# Patient Record
Sex: Male | Born: 1935 | Race: White | Hispanic: No | Marital: Married | Smoking: Former smoker
Health system: Southern US, Community
[De-identification: ages and names within clinical notes are randomized; demographics above are authoritative.]

## PROBLEM LIST (undated history)

## (undated) DIAGNOSIS — K859 Acute pancreatitis without necrosis or infection, unspecified: Secondary | ICD-10-CM

## (undated) DIAGNOSIS — K2970 Gastritis, unspecified, without bleeding: Secondary | ICD-10-CM

## (undated) DIAGNOSIS — K648 Other hemorrhoids: Secondary | ICD-10-CM

## (undated) DIAGNOSIS — N19 Unspecified kidney failure: Secondary | ICD-10-CM

## (undated) DIAGNOSIS — E119 Type 2 diabetes mellitus without complications: Secondary | ICD-10-CM

## (undated) DIAGNOSIS — K449 Diaphragmatic hernia without obstruction or gangrene: Secondary | ICD-10-CM

## (undated) DIAGNOSIS — K5790 Diverticulosis of intestine, part unspecified, without perforation or abscess without bleeding: Secondary | ICD-10-CM

## (undated) HISTORY — DX: Unspecified kidney failure: N19

## (undated) HISTORY — DX: Diaphragmatic hernia without obstruction or gangrene: K44.9

## (undated) HISTORY — DX: Other hemorrhoids: K64.8

## (undated) HISTORY — DX: Type 2 diabetes mellitus without complications: E11.9

## (undated) HISTORY — DX: Gastritis, unspecified, without bleeding: K29.70

## (undated) HISTORY — PX: KNEE SURGERY: SHX244

## (undated) HISTORY — DX: Diverticulosis of intestine, part unspecified, without perforation or abscess without bleeding: K57.90

## (undated) HISTORY — DX: Acute pancreatitis without necrosis or infection, unspecified: K85.90

## (undated) HISTORY — PX: ELBOW SURGERY: SHX618

## (undated) HISTORY — PX: OTHER SURGICAL HISTORY: SHX169

---

## 1998-08-08 ENCOUNTER — Ambulatory Visit (HOSPITAL_COMMUNITY): Admission: RE | Admit: 1998-08-08 | Discharge: 1998-08-08 | Payer: Self-pay | Admitting: Internal Medicine

## 1999-10-09 ENCOUNTER — Ambulatory Visit (HOSPITAL_COMMUNITY): Admission: RE | Admit: 1999-10-09 | Discharge: 1999-10-09 | Payer: Self-pay | Admitting: Orthopedic Surgery

## 1999-10-14 ENCOUNTER — Encounter: Payer: Self-pay | Admitting: Orthopedic Surgery

## 1999-10-14 ENCOUNTER — Encounter: Admission: RE | Admit: 1999-10-14 | Discharge: 1999-10-14 | Payer: Self-pay | Admitting: Orthopedic Surgery

## 2004-01-28 ENCOUNTER — Ambulatory Visit: Payer: Self-pay | Admitting: Family Medicine

## 2004-03-02 ENCOUNTER — Ambulatory Visit: Payer: Self-pay | Admitting: Family Medicine

## 2004-03-04 ENCOUNTER — Ambulatory Visit: Payer: Self-pay | Admitting: Family Medicine

## 2004-06-15 ENCOUNTER — Ambulatory Visit: Payer: Self-pay

## 2004-07-02 ENCOUNTER — Ambulatory Visit: Payer: Self-pay | Admitting: Cardiology

## 2004-07-14 ENCOUNTER — Ambulatory Visit: Payer: Self-pay | Admitting: *Deleted

## 2004-07-22 ENCOUNTER — Ambulatory Visit: Payer: Self-pay | Admitting: Cardiology

## 2004-08-13 ENCOUNTER — Ambulatory Visit: Payer: Self-pay | Admitting: Family Medicine

## 2005-01-25 ENCOUNTER — Ambulatory Visit: Payer: Self-pay | Admitting: Family Medicine

## 2006-06-03 ENCOUNTER — Ambulatory Visit: Payer: Self-pay | Admitting: Gastroenterology

## 2006-06-17 ENCOUNTER — Ambulatory Visit: Payer: Self-pay | Admitting: Gastroenterology

## 2009-07-08 ENCOUNTER — Ambulatory Visit (HOSPITAL_BASED_OUTPATIENT_CLINIC_OR_DEPARTMENT_OTHER): Admission: RE | Admit: 2009-07-08 | Discharge: 2009-07-08 | Payer: Self-pay | Admitting: Orthopedic Surgery

## 2010-06-09 LAB — BASIC METABOLIC PANEL
BUN: 16 mg/dL (ref 6–23)
CO2: 30 mEq/L (ref 19–32)
Calcium: 8.8 mg/dL (ref 8.4–10.5)
Chloride: 105 mEq/L (ref 96–112)
Creatinine, Ser: 0.67 mg/dL (ref 0.4–1.5)
GFR calc Af Amer: >60 mL/min (ref >60)
GFR calc non Af Amer: >60 mL/min (ref >60)
Glucose, Bld: 123 mg/dL — ABNORMAL HIGH (ref 70–99)
Potassium: 4.4 mEq/L (ref 3.5–5.1)
Sodium: 138 mEq/L (ref 135–145)

## 2010-06-09 LAB — POCT HEMOGLOBIN-HEMACUE: Hemoglobin: 13.4 g/dL (ref 13.0–17.0)

## 2011-12-02 ENCOUNTER — Encounter: Payer: Self-pay | Admitting: Gastroenterology

## 2011-12-31 ENCOUNTER — Ambulatory Visit: Payer: Self-pay | Admitting: Gastroenterology

## 2012-01-11 ENCOUNTER — Ambulatory Visit: Payer: Self-pay | Admitting: Gastroenterology

## 2013-04-04 ENCOUNTER — Encounter: Payer: Self-pay | Admitting: Nurse Practitioner

## 2013-04-04 ENCOUNTER — Telehealth: Payer: Self-pay | Admitting: *Deleted

## 2013-04-04 ENCOUNTER — Ambulatory Visit (INDEPENDENT_AMBULATORY_CARE_PROVIDER_SITE_OTHER): Payer: Medicare Other | Admitting: Nurse Practitioner

## 2013-04-04 VITALS — BP 124/82 | HR 70 | Ht 70.0 in | Wt 190.4 lb

## 2013-04-04 DIAGNOSIS — R109 Unspecified abdominal pain: Secondary | ICD-10-CM | POA: Insufficient documentation

## 2013-04-04 MED ORDER — TRAMADOL HCL 50 MG PO TABS: 50.0000 mg | ORAL_TABLET | Freq: Four times a day (QID) | ORAL | Status: DC | PRN

## 2013-04-04 NOTE — Progress Notes (Signed)
HPI :  Patient is a [age] year who had a screening colonoscopy by Dr. Jarold MottoPatterson in 2008. Patient is self-referred now for evaluation of abdominal pain and recent diarrhea. In 2013 patient saw Dr. Chales AbrahamsGupta (gastroenterologist in [place]) for  unrelated abdominal pain. He underwent EGD and colonoscopy which were normal per patient. There was a question of whether Metformin was causing the pain. Then a year or so ago patient began having periumbilical pain. He was referred back to Dr. Chales AbrahamsGupta in October 2014, a CTscan was done and also normal per patient. A couple of months ago patient developed constipation which he felt was secondary to medication he had started for facial pain. He began taking what sounds like a stool softener / laxative combination. Approximately 10 days ago patient developed acute diarrhea associated with an escalation of his periumbilical pain. He hadn't taken any laxatives for several days. Patient saw PCP in [place] who apparently felt he had food poisoning ( I don't have records). Patient was given 3 days of Bactrim and told to take Pepto. The diarrhea has significantly improved but is patient still having acute on chronic periumbilical pain so he decided to come here for further evaluation. The periumbilical pain isn't necessarily related to eating. Though the pain occurs sometimes after eating it also occurs at 10pm at night and wakes him up around 4am every morning. It is not relieved with defecation.   Past Medical History  Diagnosis Date  . Diverticulosis   . Diabetes    Family History  Problem Relation Age of Onset  . Colon cancer Father   . Thyroid cancer Sister    History  Substance Use Topics  . Smoking status: Never Smoker   . Smokeless tobacco: Never Used  . Alcohol Use: No   Current Outpatient Prescriptions  Medication Sig Dispense Refill  . atorvastatin (LIPITOR) 40 MG tablet       . B-D UF III MINI PEN NEEDLES 31G X 5 MM MISC       . latanoprost (XALATAN)  0.005 % ophthalmic solution       . oxybutynin (DITROPAN-XL) 10 MG 24 hr tablet       . valsartan (DIOVAN) 320 MG tablet       . VICTOZA 18 MG/3ML SOPN       . traMADol (ULTRAM) 50 MG tablet Take 1 tablet (50 mg total) by mouth every 6 (six) hours as needed.  40 tablet  0   No current facility-administered medications for this visit.   No Known Allergies  Review of Systems: All systems reviewed and negative except where noted in HPI.   Physical Exam: BP 124/82  Pulse 70  Ht 5\' 10"  (1.778 m)  Wt 190 lb 6.4 oz (86.365 kg)  BMI 27.32 kg/m2 Constitutional: Pleasant,well-developed, white male in no acute distress. Psychiatric: Normal mood and affect. Behavior is normal.   ASSESSMENT AND PLAN:  371. 80 year old male with chronic periumbilical pain. Patient has been followed by Dr. Chales AbrahamsGupta, a gastroenterologist in [place]. Per patient, an EGD, colonoscopy and CTscan were negative. Patient doesn't feel he is "getting anywhere" with his current gastroenterologist and would like to return to [facility]. Unfortunately I don't have any of his records, from PCP nor Dr. Chales AbrahamsGupta. We will request records and have patient come back for evaluation. He is asking for pain medication. I gave him Ultram.   2. Acute diarrhea, resolving. He took 3 days of Bactrim + Bismuth. This may have been  infectious, hard to know at this point. If he has recurrent diarrhea will check stool studies.

## 2013-04-04 NOTE — Telephone Encounter (Signed)
I called the pharmacy and spoke to pharmacist.  She said they have already filled the prescription. They did receive our faxed prescription. They called the patient to come pick up the medication.

## 2013-04-04 NOTE — Telephone Encounter (Signed)
Message copied by Derry SkillPETERMAN, Jocilynn Grade K on Wed Apr 04, 2013  1:58 PM ------      Message from: Karna ChristmasHARRIS, CHRISTY D      Created: Wed Apr 04, 2013 12:54 PM       Pt said his medication is not at the pharmacy...Marland Kitchen.needs it resent ------

## 2013-04-04 NOTE — Patient Instructions (Signed)
Make an appointment to see Dr. Sheryn Bisonavid Patterson before he retirement date of 05-04-2012.   We are sending the release with your signature to Dr. Urban GibsonGupta's office so we can get your records.

## 2013-04-05 ENCOUNTER — Telehealth: Payer: Self-pay | Admitting: Nurse Practitioner

## 2013-04-05 NOTE — Telephone Encounter (Signed)
Per Willette ClusterPaula Guenther, NP, stop Tramadol. Patient should seek care at his PCP or ED in [place]. Patient's wife aware.

## 2013-04-05 NOTE — Telephone Encounter (Signed)
Spoke with patient's wife. She states patient has vomited x 3 since last night and is very weak. States he is having pain below the belly button in the middle of his stomach. Denies fever. States patient is not having diarrhea now. He did take Tramadol twice yesterday. Please, advise.

## 2013-04-10 ENCOUNTER — Ambulatory Visit: Payer: Medicare Other | Admitting: Internal Medicine

## 2013-04-13 ENCOUNTER — Encounter: Payer: Self-pay | Admitting: Internal Medicine

## 2013-04-18 ENCOUNTER — Telehealth: Payer: Self-pay | Admitting: Gastroenterology

## 2013-04-18 ENCOUNTER — Ambulatory Visit (INDEPENDENT_AMBULATORY_CARE_PROVIDER_SITE_OTHER): Payer: Medicare Other | Admitting: Internal Medicine

## 2013-04-18 ENCOUNTER — Encounter: Payer: Self-pay | Admitting: Internal Medicine

## 2013-04-18 VITALS — BP 128/68 | HR 84 | Ht 69.0 in | Wt 209.4 lb

## 2013-04-18 DIAGNOSIS — R5381 Other malaise: Secondary | ICD-10-CM

## 2013-04-18 DIAGNOSIS — E8770 Fluid overload, unspecified: Secondary | ICD-10-CM

## 2013-04-18 DIAGNOSIS — R1033 Periumbilical pain: Secondary | ICD-10-CM

## 2013-04-18 DIAGNOSIS — K5730 Diverticulosis of large intestine without perforation or abscess without bleeding: Secondary | ICD-10-CM | POA: Insufficient documentation

## 2013-04-18 DIAGNOSIS — I10 Essential (primary) hypertension: Secondary | ICD-10-CM | POA: Insufficient documentation

## 2013-04-18 DIAGNOSIS — E7849 Other hyperlipidemia: Secondary | ICD-10-CM | POA: Insufficient documentation

## 2013-04-18 DIAGNOSIS — Z8719 Personal history of other diseases of the digestive system: Secondary | ICD-10-CM

## 2013-04-18 DIAGNOSIS — K5900 Constipation, unspecified: Secondary | ICD-10-CM

## 2013-04-18 DIAGNOSIS — E119 Type 2 diabetes mellitus without complications: Secondary | ICD-10-CM | POA: Insufficient documentation

## 2013-04-18 DIAGNOSIS — E8779 Other fluid overload: Secondary | ICD-10-CM

## 2013-04-18 DIAGNOSIS — R5383 Other fatigue: Secondary | ICD-10-CM

## 2013-04-18 DIAGNOSIS — K5732 Diverticulitis of large intestine without perforation or abscess without bleeding: Secondary | ICD-10-CM

## 2013-04-18 MED ORDER — ONDANSETRON 4 MG PO TBDP: 4.0000 mg | ORAL_TABLET | Freq: Three times a day (TID) | ORAL | Status: DC | PRN

## 2013-04-18 MED ORDER — METRONIDAZOLE 250 MG PO TABS
250.0000 mg | ORAL_TABLET | Freq: Three times a day (TID) | ORAL | Status: DC
Start: 2013-04-18 — End: 2013-05-10

## 2013-04-18 MED ORDER — OMEPRAZOLE 20 MG PO CPDR: 20.0000 mg | DELAYED_RELEASE_CAPSULE | Freq: Every day | ORAL | Status: DC

## 2013-04-18 MED ORDER — CIPROFLOXACIN HCL 500 MG PO TABS: 500.0000 mg | ORAL_TABLET | Freq: Two times a day (BID) | ORAL | Status: DC

## 2013-04-18 MED ORDER — POLYETHYLENE GLYCOL 3350 17 GM/SCOOP PO POWD: 1.0000 | Freq: Every day | ORAL | Status: DC

## 2013-04-18 NOTE — Patient Instructions (Addendum)
We have sent the following medications to your pharmacy for you to pick up at your convenience: Zofran and cipro, flagyl please take as directed  You can take Miralax over the counter as needed  Continue taking omperazole  You have a follow up appointment with Doug SouJessica Zehr P.A.-C on 05/09/2013 @ 9:30am

## 2013-04-18 NOTE — Telephone Encounter (Signed)
Called pt. Told him Dr. Rhea BeltonPyrtle spoke to Dr. Sol Passerough' swife and there was no need to reduce his diabetes meds. So it was ok to go pick up his medications. Pt's wife verbalized understanding.

## 2013-04-18 NOTE — Telephone Encounter (Signed)
Message copied by Richardo HanksHOWALD, Clive Parcel A on Wed Apr 18, 2013  2:30 PM ------      Message from: HAZELWOOD, AMY L      Created: Wed Apr 18, 2013  2:14 PM       Dr. Rhea BeltonPyrtle called while you were at lunch about this patient.  He is the last one of the morning right?      He said he talked to his PCP and he approved Cipro without reduction of diabetes meds.  Can use Cipro along with Flagyl.  He said if you have any questions let him know.  Lemme know you got this :)            Thanks      Amy ------

## 2013-04-18 NOTE — Progress Notes (Signed)
Patient ID: RAYNALDO FALCO, male   DOB: 08/30/1932, 80 y.o.   MRN: [id number] HPI: Mr. Knoedler is a 80 yo male with past medical history of hypertension, hyperlipidemia, BPH, diverticulosis and a recent history of abdominal pain possible pancreatitis who is seen in followup. He was previously noted Dr. Jarold Motto and was seen by Willette Cluster, NP on 04/04/2013. He was seen for abdominal pain and recent diarrhea. After he left the appointment with Gunnar Fusi, he was admitted the next day for a six-day hospitalization in [place].  This hospitalization was for acute renal failure and pancreatitis. He was treated supportively with IV fluids and pain control. His renal failure improved as did his abdominal pain, though it did not resolve. During his hospitalization his Trudee Kuster was held thinking this may have been the inciting factor for pancreatitis.  He had a CT scan during the hospitalization which reportedly did not reveal pancreatitis or acute abnormality.  Today he returns for office followup with his wife. He continues to have periumbilical and anterior abdominal pain below the umbilicus. This tends to be worse with eating, though this is very inconsistent. There was a time he reports when he felt he got better with eating. He is taking oxycodone on occasion for this pain and it can be associated with nausea. There are times when he feels like bowel movements and passing gas helps his abdominal pain. He reports he is having bowel movements about once per day though for the last one to 2 days his stools have been a little harder requiring some straining. He seen no blood in his stool or melena. He feels his recent nausea is related to oxycodone dosing. He does report some mild increase in heartburn and dyspepsia. He has been eating TUMS and trying to drink carbonated beverages to help with belching. Prior to his appointment here 2 weeks ago he did have significant diarrhea but this has resolved. He noticed  swelling in his lower extremities after hospitalization and his primary care provider, Dr. Sol Passer recently started him on furosemide 20 mg twice daily. He thinks this is helping. He does report some dyspnea on exertion which has improved slightly but not returned to his baseline. Also considerable fatigue and malaise.  Past Medical History  Diagnosis Date  . Diverticulosis   . Diabetes   . Renal failure     dehydration  . Pancreatitis     History reviewed. No pertinent past surgical history.  Current Outpatient Prescriptions  Medication Sig Dispense Refill  . atorvastatin (LIPITOR) 40 MG tablet Take 20 mg by mouth daily.       . B-D UF III MINI PEN NEEDLES 31G X 5 MM MISC       . carvedilol (COREG) 12.5 MG tablet Take 12.5 mg by mouth 2 (two) times daily with a meal.      . clotrimazole-betamethasone (LOTRISONE) cream Apply 1 application topically 2 (two) times daily.      Marland Kitchen glimepiride (AMARYL) 4 MG tablet Take 4 mg by mouth daily with breakfast.       . latanoprost (XALATAN) 0.005 % ophthalmic solution Place 1 drop into both eyes at bedtime.       Marland Kitchen omeprazole (PRILOSEC) 20 MG capsule Take 20 mg by mouth daily.      Marland Kitchen oxybutynin (DITROPAN-XL) 10 MG 24 hr tablet Take 10 mg by mouth daily.       Marland Kitchen oxyCODONE (OXY IR/ROXICODONE) 5 MG immediate release tablet Take 5 mg by mouth every 6 (  six) hours as needed.       . traZODone (DESYREL) 50 MG tablet Take 50 mg by mouth at bedtime.      . valsartan (DIOVAN) 320 MG tablet Take 320 mg by mouth daily.        No current facility-administered medications for this visit.    No Known Allergies  Family History  Problem Relation Age of Onset  . Colon cancer Father   . Thyroid cancer Sister     History  Substance Use Topics  . Smoking status: Never Smoker   . Smokeless tobacco: Never Used  . Alcohol Use: No    ROS: As per history of present illness, otherwise negative  BP 128/68  Pulse 84  Ht 5\' 9"  (1.753 m)  Wt 209 lb 6 oz (94.972  kg)  BMI 30.91 kg/m2 Constitutional: Well-developed and well-nourished. No distress. HEENT: Normocephalic and atraumatic. Oropharynx is clear and moist. No oropharyngeal exudate. Conjunctivae are normal.  No scleral icterus. Neck: Neck supple. Trachea midline. Cardiovascular: Normal rate, regular rhythm and intact distal pulses.  Pulmonary/chest: Effort normal and breath sounds normal. No wheezing, rales or rhonchi. Abdominal: Soft, mild to moderate periumbilical and lower abdominal, mostly central, abdominal discomfort with deep palpation with very mild guarding, no rebound, nondistended. Bowel sounds active throughout. Scattered ecchymosis across the abdomen with subcutaneous nodules likely consistent with recent insulin injections Extremities: no clubbing, cyanosis, trace to 1+ LE edema Neurological: Alert and oriented to person place and time. Skin: Skin is warm and dry. No rashes noted. Psychiatric: Normal mood and affect. Behavior is normal.  RELEVANT LABs, imaging and procedures --Colonoscopy, 06/17/2006, Dr. Jarold MottoPatterson -- diverticulosis left colon, no polyps --EGD 01/12/2012, Dr. Chales AbrahamsGupta -- small hiatal hernia, mild gastritis --Colonoscopy 07/17/2012, Dr. Chales AbrahamsGupta -- pancolonic diverticulosis predominantly in the sigmoid, small internal hemorrhoids, otherwise normal colonoscopy to the terminal ileum  CT abdomen and pelvis without contrast date 04/05/2013 Findings negative for diverticulitis or other acute intra-abdominal finding. Nonobstructive left nephrolithiasis. Colonic diverticulosis. Other findings stable as described above.  Creatinine 04/06/2013 2.1, BUN 88 -- creatinine 04/10/2013 1.1, BUN 18  Labs 04/10/2013 --Total bili 0.3, alkaline phosphatase 69, AST 101, ALT 28, lipase 152 (lipase was 553 on 04/07/2013), BNP 2330, TSH 1.24  ASSESSMENT/PLAN:  80 yo male with past medical history of hypertension, hyperlipidemia, BPH, diverticulosis and a recent history of abdominal pain  possible pancreatitis who is seen in followup.  1.  Umbilical pain -- unclear etiology of the patient's umbilical pain after recent hospitalization and CT imaging. His current abdominal pain location is not that of pancreatitis, and raises the question of diverticulitis, particularly given his recent altered bowel habits. I am recommending empiric treatment for diverticulitis with ciprofloxacin and metronidazole. There is an interaction with ciprofloxacin and glimepiride and thus before prescribing antibiotics I'm going to talk to Dr. Sol Passerough, his PCP.  We may need to reduce the dose of glimepiride while he is on antibiotics. I also am going to ask that he start MiraLax 17 g daily he is using oxycodone for pain to avoid constipation.  2.  Dyspepsia -- I'm increasing his omeprazole from 20-40 mg daily for now. This may help some with nausea  3.  Pancreatitis -- unclear etiology but was improving after hospitalization. It is possible that his diabetes medicine, Victoza, was the culprit. There is no definitive pancreatitis by imaging, but clearly he had nausea, vomiting and abdominal pain associated with elevated lipase. He reports his triglycerides were checked and reportedly not extremely  elevated making this less likely. He does not drink alcohol. Again, as described above I do not think his lower abdominal pain is consistent with pancreatitis at this time. Given that his kidney functions have recovered I will consider further cross-sectional imaging in the future to exclude subtle pancreatic abnormality. This would probably be best with MRI  4.  Volume overload -- likely as a result of IV fluids given during recent hospitalization for acute kidney injury and pancreatitis. He has been started on diuretics and will continue with furosemide 20 mg twice daily  5.  Fatigue and malaise -- possibly secondary to #1 and also recent hospitalization which lasted 6 days. Treatment as above  I would like to see him  back in 2-3 weeks for reassessment, sooner if necessary.  Given his recent colonoscopy, I do not think repeating this test at this time would be overly helpful.

## 2013-04-19 ENCOUNTER — Telehealth: Payer: Self-pay | Admitting: Internal Medicine

## 2013-04-19 MED ORDER — OMEPRAZOLE 20 MG PO CPDR: 40.0000 mg | DELAYED_RELEASE_CAPSULE | Freq: Every day | ORAL | Status: DC

## 2013-04-19 NOTE — Telephone Encounter (Signed)
Sent in new Rx to pt's pharmacy. Pharmacy will notify pt when ready

## 2013-04-20 ENCOUNTER — Ambulatory Visit: Payer: Medicare Other | Admitting: Gastroenterology

## 2013-05-02 ENCOUNTER — Encounter: Payer: Self-pay | Admitting: Gastroenterology

## 2013-05-08 ENCOUNTER — Ambulatory Visit: Payer: Medicare Other | Admitting: Gastroenterology

## 2013-05-10 ENCOUNTER — Ambulatory Visit (INDEPENDENT_AMBULATORY_CARE_PROVIDER_SITE_OTHER): Payer: Medicare Other | Admitting: Gastroenterology

## 2013-05-10 ENCOUNTER — Encounter: Payer: Self-pay | Admitting: Gastroenterology

## 2013-05-10 ENCOUNTER — Other Ambulatory Visit (INDEPENDENT_AMBULATORY_CARE_PROVIDER_SITE_OTHER): Payer: Medicare Other

## 2013-05-10 VITALS — BP 132/74 | HR 68 | Ht 69.0 in | Wt 199.0 lb

## 2013-05-10 DIAGNOSIS — R109 Unspecified abdominal pain: Secondary | ICD-10-CM | POA: Insufficient documentation

## 2013-05-10 DIAGNOSIS — R748 Abnormal levels of other serum enzymes: Secondary | ICD-10-CM

## 2013-05-10 DIAGNOSIS — Z8719 Personal history of other diseases of the digestive system: Secondary | ICD-10-CM

## 2013-05-10 LAB — COMPREHENSIVE METABOLIC PANEL
ALT: 27 U/L (ref 0–53)
AST: 120 U/L — ABNORMAL HIGH (ref 0–37)
Albumin: 3.6 g/dL (ref 3.5–5.2)
Alkaline Phosphatase: 64 U/L (ref 39–117)
BUN: 20 mg/dL (ref 6–23)
CO2: 30 meq/L (ref 19–32)
CREATININE: 0.7 mg/dL (ref 0.4–1.5)
Calcium: 9 mg/dL (ref 8.4–10.5)
Chloride: 102 mEq/L (ref 96–112)
GFR: 113.79 mL/min (ref >60.00)
Glucose, Bld: 125 mg/dL — ABNORMAL HIGH (ref 70–99)
Potassium: 4.1 mEq/L (ref 3.5–5.1)
Sodium: 136 mEq/L (ref 135–145)
Total Bilirubin: 0.6 mg/dL (ref 0.3–1.2)
Total Protein: 6.9 g/dL (ref 6.0–8.3)

## 2013-05-10 LAB — CBC WITH DIFFERENTIAL/PLATELET
BASOS PCT: 0.3 % (ref 0.0–3.0)
Basophils Absolute: 0 10*3/uL (ref 0.0–0.1)
EOS ABS: 0.1 10*3/uL (ref 0.0–0.7)
Eosinophils Relative: 1.4 % (ref 0.0–5.0)
HCT: 39.3 % (ref 39.0–52.0)
HEMOGLOBIN: 13 g/dL (ref 13.0–17.0)
Lymphocytes Relative: 22.3 % (ref 12.0–46.0)
Lymphs Abs: 1.6 10*3/uL (ref 0.7–4.0)
MCHC: 33.1 g/dL (ref 30.0–36.0)
MCV: 96 fl (ref 78.0–100.0)
MONOS PCT: 10.1 % (ref 3.0–12.0)
Monocytes Absolute: 0.7 10*3/uL (ref 0.1–1.0)
NEUTROS ABS: 4.6 10*3/uL (ref 1.4–7.7)
Neutrophils Relative %: 65.9 % (ref 43.0–77.0)
Platelets: 153 10*3/uL (ref 150.0–400.0)
RBC: 4.09 Mil/uL — AB (ref 4.22–5.81)
RDW: 14 % (ref 11.5–14.6)
WBC: 7 10*3/uL (ref 4.5–10.5)

## 2013-05-10 LAB — LIPASE: LIPASE: 24 U/L (ref 11.0–59.0)

## 2013-05-10 LAB — AMYLASE: AMYLASE: 58 U/L (ref 27–131)

## 2013-05-10 NOTE — Patient Instructions (Signed)
You have been scheduled for an MRI at [facility] on 05/25/2013. Your appointment time is 12pm. Please arrive 15 minutes prior to your appointment time for registration purposes. Nothing to eat or drink 4 hours before. However, if you have any metal in your body, have a pacemaker or defibrillator, please be sure to let your ordering physician know. This test typically takes 45 minutes to 1 hour to complete.  Go to the basement today for labs

## 2013-05-10 NOTE — Progress Notes (Addendum)
05/10/2013 Alan ChuDaniel R Wallace [id number] [birth year]   History of Present Illness:  This is a pleasant 80 year old man who is known to Dr. Rhea BeltonPyrtle for a recent visit regarding his complaints of abdominal pain. He was seen just 3 weeks ago for this pain and is here today for follow-up. Please see Dr. Lauro FranklinPyrtle's note from 04/18/2013 for further details. At that time Dr. Rhea BeltonPyrtle had treated him empirically for diverticulitis with a 7 day course of Cipro and Flagyl. The patient is here today with his wife and they tell me that he only took the antibiotics for one day before returning to see his PCP stating that he "did not feel well". His PCP discontinued the Flagyl and told him to continue the Cipro only. He did complete a seven-day course of Cipro, but reports that his abdominal pain has had minimal change since his last visit here. He states that at 10 PM last night he felt like he had a "hunger pain" so he ate a small snack. He went to bed around 11:30 PM but then woke around 2 AM with abdominal pain. The pain is located in the same area as it has been all along, at the umbilicus.  He states that at 3 AM he could not fall asleep due to the pain so he took one of his pain pills, tramadol. He says that currently he is not having any pain because of the pain medication he took this morning. He states that right now his bowels are moving well and says that they are the best that they've been in quite some time. He denies any blood in his stool. He complains of some minimal nausea after breakfast at times, but no vomiting. Appetite is good overall. As stated in his previous visit he had a recent hospitalization and diagnosis of possible pancreatitis with elevated lipase, but cause was not definitively determined.  He does admit to urinary frequency, but has seen urology with some evaluation within the past couple of months.  Is currently taking oxybutynin for those symptoms.  No pain with urination.   Current  Medications, Allergies, Past Medical History, Past Surgical History, Family History and Social History were reviewed in [facility] electronic medical record.   Physical Exam: BP 132/74  Pulse 68  Ht 5\' 9"  (1.753 m)  Wt 199 lb (90.266 kg)  BMI 29.37 kg/m2 General: Elderly white male in no acute distress Head: Normocephalic and atraumatic Eyes:  Sclerae anicteric, conjunctiva pink  Ears: Normal auditory acuity Lungs: Clear throughout to auscultation Heart: Regular rate and rhythm Abdomen: Soft, non-distended.  Normal bowel sounds.  Minimal periumbilical TTP without R/R/G.  Small ecchymoses seen on abdomen likely secondary to insulin injections. Musculoskeletal: Symmetrical with no gross deformities  Extremities: No edema  Neurological: Alert oriented x 4, grossly non-focal Psychological:  Alert and cooperative. Normal mood and affect  Assessment and Recommendations: -Periumbilical pain with recent history of pancreatitis/elevated lipase:  Still unclear as to the cause of his pain.  No improvement after course of antibiotics for empiric treatment of diverticulitis.  Bowels are moving well.  Will repeat labs including CBC, CMP, amylase/lipase, and U/A with culture.  Will also order MRI abdomen and pelvis with contrast.  I discussed this case with Dr. Rhea BeltonPyrtle.  Addendum: Reviewed and agree with management.  Await imaging results Beverley FiedlerJay M Pyrtle, MD

## 2013-05-11 LAB — CULTURE, URINE COMPREHENSIVE
Colony Count: NO GROWTH
Organism ID, Bacteria: NO GROWTH

## 2013-05-25 ENCOUNTER — Other Ambulatory Visit: Payer: Self-pay | Admitting: Gastroenterology

## 2013-05-25 ENCOUNTER — Ambulatory Visit (HOSPITAL_COMMUNITY): Admission: RE | Admit: 2013-05-25 | Payer: Medicare Other | Source: Ambulatory Visit

## 2013-05-25 ENCOUNTER — Ambulatory Visit (HOSPITAL_COMMUNITY)
Admission: RE | Admit: 2013-05-25 | Discharge: 2013-05-25 | Disposition: A | Discharge status: Home / Self Care | Payer: Medicare Other | Source: Ambulatory Visit | Attending: Gastroenterology | Admitting: Gastroenterology

## 2013-05-25 DIAGNOSIS — R109 Unspecified abdominal pain: Secondary | ICD-10-CM

## 2013-05-25 DIAGNOSIS — K5730 Diverticulosis of large intestine without perforation or abscess without bleeding: Secondary | ICD-10-CM | POA: Insufficient documentation

## 2013-05-25 DIAGNOSIS — R110 Nausea: Secondary | ICD-10-CM | POA: Insufficient documentation

## 2013-05-25 DIAGNOSIS — R1033 Periumbilical pain: Secondary | ICD-10-CM | POA: Insufficient documentation

## 2013-05-25 DIAGNOSIS — N400 Enlarged prostate without lower urinary tract symptoms: Secondary | ICD-10-CM | POA: Insufficient documentation

## 2013-05-25 MED ORDER — GADOBENATE DIMEGLUMINE 529 MG/ML IV SOLN
19.0000 mL | Freq: Once | INTRAVENOUS | Status: AC | PRN
  Administered 2013-05-25: 19 mL via INTRAVENOUS

## 2013-12-17 ENCOUNTER — Other Ambulatory Visit: Payer: Self-pay | Admitting: Internal Medicine

## 2014-03-08 LAB — HM DIABETES EYE EXAM

## 2014-03-18 ENCOUNTER — Telehealth: Payer: Self-pay | Admitting: Internal Medicine

## 2014-03-18 MED ORDER — OMEPRAZOLE 20 MG PO CPDR: 40.0000 mg | DELAYED_RELEASE_CAPSULE | Freq: Every day | ORAL | Status: DC

## 2014-03-18 NOTE — Telephone Encounter (Signed)
Rx sent. Patient needs office visit with Dr Rhea BeltonPyrtle for further refills.

## 2014-06-10 ENCOUNTER — Ambulatory Visit (INDEPENDENT_AMBULATORY_CARE_PROVIDER_SITE_OTHER): Payer: Medicare Other | Admitting: Gastroenterology

## 2014-06-10 ENCOUNTER — Encounter: Payer: Self-pay | Admitting: Gastroenterology

## 2014-06-10 VITALS — BP 140/70 | HR 76 | Ht 69.0 in | Wt 209.0 lb

## 2014-06-10 DIAGNOSIS — K219 Gastro-esophageal reflux disease without esophagitis: Secondary | ICD-10-CM

## 2014-06-10 DIAGNOSIS — R945 Abnormal results of liver function studies: Secondary | ICD-10-CM

## 2014-06-10 DIAGNOSIS — R7989 Other specified abnormal findings of blood chemistry: Secondary | ICD-10-CM

## 2014-06-10 MED ORDER — OMEPRAZOLE 40 MG PO CPDR: 40.0000 mg | DELAYED_RELEASE_CAPSULE | Freq: Two times a day (BID) | ORAL | Status: DC

## 2014-06-10 NOTE — Patient Instructions (Addendum)
We have sent the following medications to your pharmacy for you to pick up at your convenience: omeprazole  Please follow up in 12 weeks with Dr. Rhea BeltonPyrtle

## 2014-06-10 NOTE — Progress Notes (Addendum)
     06/10/2014 Alan ChuDaniel R Wallace [id number] 11-21-1932   History of Present Illness:  This is a pleasant 81 year old male who is known to Alan Wallace.  He was last seen by myself in 04/2013.  He presents to our office today with his wife primarily so that he can obtain refills on his PPI, omeprazole 20 mg BID.  He says that despite taking this medication he continues to have heartburn and a "knot in his stomach" primarily in the mornings.  Does not have pain like he did previously, just feels uneasy.  Says that he is using a lot of Tums.  Both he and his wife say that he worries a lot and agree/understand that this may contribute to his complaints.  --Colonoscopy, 06/17/2006, Alan Wallace -- diverticulosis left colon, no polyps --EGD 01/12/2012, Alan Wallace -- small hiatal hernia, mild gastritis --Colonoscopy 07/17/2012, Alan Wallace -- pancolonic diverticulosis predominantly in the sigmoid, small internal hemorrhoids, otherwise normal colonoscopy to the terminal ileum  --CT abdomen and pelvis without contrast date 04/05/2013 Findings negative for diverticulitis or other acute intra-abdominal finding. Nonobstructive left nephrolithiasis. Colonic diverticulosis. Other findings stable as described above. --MRI abdomen and pelvis with and without contrast on 05/25/2013 without any acute findings, but only some colonic diverticulosis and mildly enlarged prostate.  He also states that his PCP said that his LFT's were elevated on his last labs about 3 months ago.  The plan is to recheck them at 6 months, which will be in June.  He is on a statin, which he has been taking for years.  Denies ETOH use.  Uses only occasional tylenol.   Current Medications, Allergies, Past Medical History, Past Surgical History, Family History and Social History were reviewed in [facility] electronic medical record.   Physical Exam: BP 140/70 mmHg  Pulse 76  Ht 5\' 9"  (1.753 m)  Wt 209 lb (94.802 kg)  BMI 30.85  kg/m2 General: Well developed white male in no acute distress Wallace: Normocephalic and atraumatic Eyes:  Sclerae anicteric, conjunctiva pink  Ears: Normal auditory acuity Lungs: Clear throughout to auscultation Heart: Regular rate and rhythm Abdomen: Soft, non-distended.  Normal bowel sounds.  Non-tender. Musculoskeletal: Symmetrical with no gross deformities  Extremities: No edema  Neurological: Alert oriented x 4, grossly non-focal Psychological:  Alert and cooperative. Normal mood and affect  Assessment and Recommendations: -GERD:  Currently on omeprazole 20 mg BID.  Still eating a lot of Tums.  Will increase to omeprazole 40 mg BID and see how he does with that.  I think that he likely has some IBS and functional dyspepsia.   -Elevated LFT's:  Will obtain labs from his PCP, Alan Wallace.  This elevation may be from his statin therapy.  Liver looks fine on MRI from 05/2013.  LFT's to be rechecked again by PCP in June.  *Will follow-up here in 8-12 weeks.  Addendum: Reviewed and agree with management. Alan FiedlerJay M Pyrtle, MD

## 2014-06-13 ENCOUNTER — Other Ambulatory Visit: Payer: Self-pay | Admitting: Internal Medicine

## 2014-06-18 ENCOUNTER — Telehealth: Payer: Self-pay | Admitting: Gastroenterology

## 2014-06-18 MED ORDER — OMEPRAZOLE 40 MG PO CPDR: 40.0000 mg | DELAYED_RELEASE_CAPSULE | Freq: Two times a day (BID) | ORAL | Status: DC

## 2014-06-18 NOTE — Telephone Encounter (Signed)
Rx re-sent as requested.

## 2014-07-10 ENCOUNTER — Telehealth: Payer: Self-pay | Admitting: Gastroenterology

## 2014-07-10 MED ORDER — OMEPRAZOLE 20 MG PO CPDR: DELAYED_RELEASE_CAPSULE | ORAL | Status: DC

## 2014-07-10 NOTE — Telephone Encounter (Signed)
Spoke to the patient. He called his insurance company. He told them he takes Omeprazole 40 twice daily.  They told him they won't cover that but he can take Omeprazole 20 , 2 caps twice daily (4 caps) . The patient asked me to send a 90 day supply.  If they wont cover that we can change it to 20 mg , 1 cap twice daily. I sent this prescription to [facility] [address], [place], [place].

## 2014-08-08 ENCOUNTER — Encounter: Payer: Self-pay | Admitting: *Deleted

## 2014-09-10 ENCOUNTER — Encounter: Payer: Self-pay | Admitting: Internal Medicine

## 2014-09-10 ENCOUNTER — Ambulatory Visit (INDEPENDENT_AMBULATORY_CARE_PROVIDER_SITE_OTHER): Payer: Medicare Other | Admitting: Internal Medicine

## 2014-09-10 VITALS — BP 152/80 | HR 68 | Ht 69.0 in | Wt 209.0 lb

## 2014-09-10 DIAGNOSIS — R1033 Periumbilical pain: Secondary | ICD-10-CM | POA: Diagnosis not present

## 2014-09-10 DIAGNOSIS — K219 Gastro-esophageal reflux disease without esophagitis: Secondary | ICD-10-CM | POA: Diagnosis not present

## 2014-09-10 DIAGNOSIS — R7401 Elevation of levels of liver transaminase levels: Secondary | ICD-10-CM

## 2014-09-10 DIAGNOSIS — K5900 Constipation, unspecified: Secondary | ICD-10-CM

## 2014-09-10 DIAGNOSIS — R740 Nonspecific elevation of levels of transaminase and lactic acid dehydrogenase [LDH]: Secondary | ICD-10-CM

## 2014-09-10 MED ORDER — SUCRALFATE 1 G PO TABS: 1.0000 g | ORAL_TABLET | Freq: Three times a day (TID) | ORAL | Status: DC

## 2014-09-10 NOTE — Progress Notes (Signed)
Subjective:    Patient ID: Alan Wallace, male    DOB: 08-Apr-1932, 81 y.o.   MRN: [id number]  HPI Alan Wallace is an 81 year old male who is seen in follow-up. He is here today with his wife. He has a history of GERD, hiatal hernia, gastritis and diverticulosis. He was last seen in the office on 06/10/2014 by Doug Sou, PA-C. He was having some mid abdominal discomfort at that time and his omeprazole was increased from 20 twice a day to 40 twice a day. Doesn't feel that this is helped much. He overall feels well. He reports his stomach is still "not right". He reports feeling a heaviness or fullness after eating. This seems to be worse with foods such as lettuce and salads. He denies early satiety, nausea or vomiting. Appetite has been good. He denies weight loss. He feels that taking TUMS helps with the discomfort. He reports his bowel movements previously were loose but he took antibiotics 1 month ago for walking pneumonia. Since this time his bowel movements have been harder and he reports he is felt nearly constipated. He denies blood in his stool or melena. When asked to point to the pain it seems to be located closer to the umbilicus than the epigastrium.   He has history of isolated elevated AST. He denies jaundice, ascites, lower extremity edema, itching. No family history of liver dysfunction  --Colonoscopy, 06/17/2006, Dr. Jarold Motto -- diverticulosis left colon, no polyps --EGD 01/12/2012, Dr. Chales Abrahams -- small hiatal hernia, mild gastritis --Colonoscopy 07/17/2012, Dr. Chales Abrahams -- pancolonic diverticulosis predominantly in the sigmoid, small internal hemorrhoids, otherwise normal colonoscopy to the terminal ileum3   Review of Systems  As per history of present illness, otherwise negative  Current Medications, Allergies, Past Medical History, Past Surgical History, Family History and Social History were reviewed in [facility] electronic medical record.      Objective:   Physical Exam BP 152/80 mmHg  Pulse 68  Ht 5\' 9"  (1.753 m)  Wt 209 lb (94.802 kg)  BMI 30.85 kg/m2 Constitutional: Well-developed and well-nourished. No distress. HEENT: Normocephalic and atraumatic. Oropharynx is clear and moist. No oropharyngeal exudate. Conjunctivae are normal.  No scleral icterus. Neck: Neck supple. Trachea midline. Cardiovascular: Normal rate, regular rhythm and intact distal pulses. 2/6 sem Pulmonary/chest: Effort normal and breath sounds normal. No wheezing, rales or rhonchi. Abdominal: Soft, nontender, nondistended. Bowel sounds active throughout. Extremities: no clubbing, cyanosis, or edema Lymphadenopathy: No cervical adenopathy noted. Neurological: Alert and oriented to person place and time. Skin: Skin is warm and dry. No rashes noted. Psychiatric: Normal mood and affect. Behavior is normal.  --CT abdomen and pelvis without contrast date 04/05/2013 Findings negative for diverticulitis or other acute intra-abdominal finding. Nonobstructive left nephrolithiasis. Colonic diverticulosis. Other findings stable as described above. --MRI abdomen and pelvis with and without contrast on 05/25/2013 without any acute findings, but only some colonic diverticulosis and mildly enlarged prostate.  Labs from today (PCP) Hemoglobin A1c 8.6 CMP remains pending     Assessment & Plan:  81 year old male seen in follow-up with history of GERD, dyspepsia, gastritis, diverticulosis  1. GERD with abdominal discomfort -- he is not having true heartburn and increasing PPI did not seem to benefit his somewhat vague abdominal discomfort. Will change omeprazole back to 20 mg twice a day. Given that his symptoms seem to improve with antiacids, will give trial of Carafate 1 g before meals and at bedtime. I asked that he call me in one month to let  me know if symptoms improve. If not discontinue Carafate. Prior endoscopy unrevealing for source of discomfort.  2. Mild constipation/change in  bowel habits -- occurred after antibiotic. We discussed likely due to change in gut microbiome associated with antibiotics. Recommended align 1 capsule daily for at least one month as a probiotic. Let me know if not getting better  3. History of isolated elevated AST -- nonspecific, no evidence of liver dysfunction or chronic liver disease. Prior cross-sectional imaging showed normal liver. Follow-up AST which was drawn earlier today  Office follow-up in 3-4 months, sooner if necessary

## 2014-09-10 NOTE — Patient Instructions (Addendum)
  Decrease your omeprazole to one tablet twice a day.  Take 30 minutes prior to breakfast and supper.   Please purchase Align which is an over the counter probiotic to put the good bacteria back into your colon.  Take one daily for a month.  You may continue taking this if it helps.   Please follow up with your Primary Care Dr. Regarding your elevated blood pressure.   We have sent the following medications to your pharmacy for you to pick up at your convenience: sucralfate   Follow up with Dr Rhea Belton in 3 months.   I appreciate the opportunity to care for you.

## 2014-10-14 ENCOUNTER — Telehealth: Payer: Self-pay | Admitting: Internal Medicine

## 2014-10-14 NOTE — Telephone Encounter (Signed)
Pt wanted to call and let Dr. PyrtleRhea Beltonow that the carafate has helped his stomach especially through the night. Dr. Rhea Belton notified.

## 2014-10-15 NOTE — Telephone Encounter (Signed)
Great to hear Continue with the new therapy and call if problems arise

## 2015-01-16 ENCOUNTER — Other Ambulatory Visit: Payer: Self-pay | Admitting: Internal Medicine

## 2015-02-17 ENCOUNTER — Telehealth: Payer: Self-pay | Admitting: Internal Medicine

## 2015-02-17 MED ORDER — SUCRALFATE 1 G PO TABS: 1.0000 g | ORAL_TABLET | Freq: Three times a day (TID) | ORAL | Status: DC

## 2015-02-17 NOTE — Telephone Encounter (Signed)
Rx refilled.

## 2015-02-17 NOTE — Telephone Encounter (Signed)
Ok to refill 

## 2015-02-17 NOTE — Telephone Encounter (Signed)
Dr Rhea BeltonPyrtle- Patient wants refills for sucralfate. Per your last office note, patient was to follow up in 3-4 months. However, there is a later phone call where patient stated he was filling much better on the sucralfate and was told to continue therapy and call back if problems arise. Does he need to come in for follow up or do you just want me to refill meds?

## 2015-05-05 ENCOUNTER — Ambulatory Visit: Payer: Medicare Other | Admitting: Gastroenterology

## 2015-09-28 DIAGNOSIS — I358 Other nonrheumatic aortic valve disorders: Secondary | ICD-10-CM | POA: Insufficient documentation

## 2016-03-08 ENCOUNTER — Encounter: Payer: Self-pay | Admitting: *Deleted

## 2016-04-17 DIAGNOSIS — I25119 Atherosclerotic heart disease of native coronary artery with unspecified angina pectoris: Secondary | ICD-10-CM | POA: Insufficient documentation

## 2016-05-06 ENCOUNTER — Encounter: Payer: Self-pay | Admitting: Internal Medicine

## 2016-09-20 ENCOUNTER — Encounter: Payer: Self-pay | Admitting: Cardiology

## 2016-09-21 ENCOUNTER — Encounter: Payer: Self-pay | Admitting: Cardiology

## 2016-09-21 ENCOUNTER — Ambulatory Visit (INDEPENDENT_AMBULATORY_CARE_PROVIDER_SITE_OTHER): Payer: Medicare Other | Admitting: Cardiology

## 2016-09-21 VITALS — BP 134/84 | HR 75 | Ht 70.0 in | Wt 217.1 lb

## 2016-09-21 DIAGNOSIS — I25119 Atherosclerotic heart disease of native coronary artery with unspecified angina pectoris: Secondary | ICD-10-CM | POA: Diagnosis not present

## 2016-09-21 DIAGNOSIS — J8410 Pulmonary fibrosis, unspecified: Secondary | ICD-10-CM | POA: Diagnosis not present

## 2016-09-21 DIAGNOSIS — R0602 Shortness of breath: Secondary | ICD-10-CM

## 2016-09-21 DIAGNOSIS — E785 Hyperlipidemia, unspecified: Secondary | ICD-10-CM

## 2016-09-21 DIAGNOSIS — R5383 Other fatigue: Secondary | ICD-10-CM | POA: Diagnosis not present

## 2016-09-21 DIAGNOSIS — I10 Essential (primary) hypertension: Secondary | ICD-10-CM | POA: Diagnosis not present

## 2016-09-21 DIAGNOSIS — J849 Interstitial pulmonary disease, unspecified: Secondary | ICD-10-CM | POA: Insufficient documentation

## 2016-09-21 MED ORDER — TICAGRELOR 90 MG PO TABS: 90.0000 mg | ORAL_TABLET | Freq: Two times a day (BID) | ORAL | 3 refills | Status: DC

## 2016-09-21 NOTE — Progress Notes (Signed)
Cardiology Office Note:    Date:  09/21/2016   ID:  Alan Wallace, DOB 12-13-1932, MRN [id number]  PCP:  Olive Bass, MD  Cardiologist:  Norman Herrlich, MD    Referring MD: Olive Bass, MD    ASSESSMENT:    1. SOB (shortness of breath) on exertion   2. Coronary artery disease involving native coronary artery of native heart with angina pectoris (HCC)   3. Essential hypertension   4. Pulmonary fibrosis (HCC)   5. Hyperlipidemia, unspecified hyperlipidemia type   6. Other fatigue    PLAN:    In order of problems listed above:  1.  The shortness of breath clinically appears to be predominantly pulmonary in etiology I index suspicion for ischemic etiology is low. He'll undergone evaluation including CT of the chest regarding extent of fibrosis echocardiogram and BNP level regarding heart failure. 2. Stable continue current treatment including dual and a platelet beta blocker and high intensity statin 3. Stable continue current treatment including ARB beta blocker. 4. Clinically suspect her shortness of breath is due to pulmonary fibrosis diagnostic plan as outlined. 5. Stable continues high intensity statin 6. Check TSH  Next appointment: One month   Medication Adjustments/Labs and Tests Ordered: Current medicines are reviewed at length with the patient today.  Concerns regarding medicines are outlined above.  Orders Placed This Encounter  Procedures  . CT Chest Wo Contrast  . Comprehensive Metabolic Panel (CMET)  . B Nat Peptide  . TSH  . EKG 12-Lead  . ECHOCARDIOGRAM COMPLETE   Meds ordered this encounter  Medications  . ticagrelor (BRILINTA) 90 MG TABS tablet    Sig: Take 1 tablet (90 mg total) by mouth 2 (two) times daily.    Dispense:  180 tablet    Refill:  3    Chief Complaint  Patient presents with  . Follow-up    Routine flup appt   . Shortness of Breath  . Fatigue    History of Present Illness:    Alan Wallace is a 83 y.o. male with a  hx of PCI and stent of his LAD without ACS 05/04/16.At that time he had rapid progression of exertional dyspnea that was severe and limiting abnormal stress test found to have severe obstructive CAD and our expectation was to be improved afterwards. Despite the fact that he continues to exercise he still has severe limiting exertional dyspnea somewhat variable at times with minor activities and marked exercise intolerance. He has had no typical anginal symptoms. His chest x-ray prior to cardiac intervention showed pulmonary fibrosis. He is not having cough sputum production or hemoptysis. For further evaluation and asked him to have a chest CT performed and if he has significant findings were referred to pulmonary and to evaluate shortness of breath echocardiogram and lab work including BNP and TSH. At this time I do not think he requires a repeat ischemia evaluation. Compliance with diet, lifestyle and medications: yes Past Medical History:  Diagnosis Date  . Diabetes (HCC)   . Diverticulosis   . Gastritis   . Hiatal hernia   . Internal hemorrhoids   . Pancreatitis   . Renal failure    dehydration    Past Surgical History:  Procedure Laterality Date  . ELBOW SURGERY Right   . fatty tumor removal     multiple  . KNEE SURGERY Right     Current Medications: Current Meds  Medication Sig  . aspirin 81 MG tablet Take 81 mg  by mouth daily.  Marland Kitchen. atorvastatin (LIPITOR) 40 MG tablet Take 20 mg by mouth daily.   . B-D UF III MINI PEN NEEDLES 31G X 5 MM MISC   . carvedilol (COREG) 12.5 MG tablet Take 12.5 mg by mouth 2 (two) times daily with a meal.  . clotrimazole-betamethasone (LOTRISONE) cream Apply 1 application topically 2 (two) times daily as needed.   . insulin detemir (LEVEMIR) 100 UNIT/ML injection Inject 18 Units into the skin daily.   . insulin lispro (HUMALOG KWIKPEN) 100 UNIT/ML KiwkPen INJECT 3 UNITS INTO THE SKIN 4 TIMES DAILY  . latanoprost (XALATAN) 0.005 % ophthalmic solution  Place 1 drop into both eyes at bedtime.   . nitroGLYCERIN (NITROSTAT) 0.4 MG SL tablet Place 0.4 mg under the tongue every 5 (five) minutes as needed for chest pain.  Marland Kitchen. omeprazole (PRILOSEC) 20 MG capsule Take 20 mg by mouth 2 (two) times daily before a meal.  . ticagrelor (BRILINTA) 90 MG TABS tablet Take 1 tablet (90 mg total) by mouth 2 (two) times daily.  . valsartan-hydrochlorothiazide (DIOVAN-HCT) 320-12.5 MG tablet TK 1 T PO QAM FOR HIGH BP  . [DISCONTINUED] ticagrelor (BRILINTA) 90 MG TABS tablet Take 90 mg by mouth 2 (two) times daily.     Allergies:   Patient has no known allergies.   Social History   Social History  . Marital status: Married    Spouse name: N/A  . Number of children: N/A  . Years of education: N/A   Occupational History  . Retired    Social History Main Topics  . Smoking status: Former Games developermoker  . Smokeless tobacco: Never Used     Comment: quit 50+ years ago  . Alcohol use No  . Drug use: No  . Sexual activity: Not Asked   Other Topics Concern  . None   Social History Narrative  . None     Family History: The patient's family history includes Colon cancer in his father; Pancreatic cancer in his father; Thyroid cancer in his sister. There is no history of Kidney disease, Liver disease, Prostate cancer, or Stomach cancer. ROS:   Please see the history of present illness.    All other systems reviewed and are negative.  EKGs/Labs/Other Studies Reviewed:    The following studies were reviewed today:   EKG:  EKG ordered today.  The ekg ordered today demonstrates Sinus rhythm normal  Recent Labs: No results found for requested labs within last 8760 hours.  Recent Lipid Panel No results found for: CHOL, TRIG, HDL, CHOLHDL, VLDL, LDLCALC, LDLDIRECT  Physical Exam:    VS:  BP 134/84   Pulse 75   Ht 5\' 10"  (1.778 m)   Wt 217 lb 1.9 oz (98.5 kg)   SpO2 93%   BMI 31.15 kg/m     Wt Readings from Last 3 Encounters:  09/21/16 217 lb 1.9 oz  (98.5 kg)  09/10/14 209 lb (94.8 kg)  06/10/14 209 lb (94.8 kg)     GEN:  Well nourished, well developed in no acute distress HEENT: Normal NECK: No JVD; No carotid bruits LYMPHATICS: No lymphadenopathy CARDIAC: RRR, no murmurs, rubs, gallops RESPIRATORY:  Clear to auscultation without rales, wheezing or rhonchi  ABDOMEN: Soft, non-tender, non-distended MUSCULOSKELETAL:  No edema; No deformity  SKIN: Warm and dry NEUROLOGIC:  Alert and oriented x 3 PSYCHIATRIC:  Normal affect    Signed, Norman HerrlichBrian Dayveon Halley, MD  09/21/2016 5:02 PM    [facility]

## 2016-09-21 NOTE — Patient Instructions (Signed)
Medication Instructions:  Your physician recommends that you continue on your current medications as directed. Please refer to the Current Medication list given to you today.   Labwork: Your physician recommends that you return for lab work in: today. CMP, BNP, TSH.   Testing/Procedures: Your physician has requested that you have an echocardiogram. Echocardiography is a painless test that uses sound waves to create images of your heart. It provides your doctor with information about the size and shape of your heart and how well your heart's chambers and valves are working. This procedure takes approximately one hour. There are no restrictions for this procedure.  Non-Cardiac CT scanning, (CAT scanning), is a noninvasive, special x-ray that produces cross-sectional images of the body using x-rays and a computer. CT scans help physicians diagnose and treat medical conditions. For some CT exams, a contrast material is used to enhance visibility in the area of the body being studied. CT scans provide greater clarity and reveal more details than regular x-ray exams.    Follow-Up: Your physician recommends that you schedule a follow-up appointment in: 1 month.   Any Other Special Instructions Will Be Listed Below (If Applicable).     If you need a refill on your cardiac medications before your next appointment, please call your pharmacy.

## 2016-09-26 LAB — COMPREHENSIVE METABOLIC PANEL
A/G RATIO: 1.7 (ref 1.2–2.2)
ALK PHOS: 72 IU/L (ref 39–117)
ALT: 19 IU/L (ref 0–44)
AST: 127 IU/L — ABNORMAL HIGH (ref 0–40)
Albumin: 4.3 g/dL (ref 3.5–4.7)
BUN/Creatinine Ratio: 23 (ref 10–24)
BUN: 19 mg/dL (ref 8–27)
Bilirubin Total: 0.4 mg/dL (ref 0.0–1.2)
CALCIUM: 8.7 mg/dL (ref 8.6–10.2)
CHLORIDE: 100 mmol/L (ref 96–106)
CO2: 26 mmol/L (ref 20–29)
Creatinine, Ser: 0.82 mg/dL (ref 0.76–1.27)
GFR calc Af Amer: 95 mL/min/{1.73_m2} (ref >59)
GFR, EST NON AFRICAN AMERICAN: 82 mL/min/{1.73_m2} (ref >59)
Globulin, Total: 2.5 g/dL (ref 1.5–4.5)
Glucose: 126 mg/dL — ABNORMAL HIGH (ref 65–99)
POTASSIUM: 4.4 mmol/L (ref 3.5–5.2)
Sodium: 142 mmol/L (ref 134–144)
Total Protein: 6.8 g/dL (ref 6.0–8.5)

## 2016-09-26 LAB — BRAIN NATRIURETIC PEPTIDE

## 2016-09-26 LAB — TSH: TSH: 1.63 u[IU]/mL (ref 0.450–4.500)

## 2016-10-06 ENCOUNTER — Ambulatory Visit (HOSPITAL_BASED_OUTPATIENT_CLINIC_OR_DEPARTMENT_OTHER)
Admission: RE | Admit: 2016-10-06 | Discharge: 2016-10-06 | Disposition: A | Discharge status: Home / Self Care | Payer: Medicare Other | Source: Ambulatory Visit | Attending: Cardiology | Admitting: Cardiology

## 2016-10-06 DIAGNOSIS — R0602 Shortness of breath: Secondary | ICD-10-CM | POA: Diagnosis present

## 2016-10-06 DIAGNOSIS — J8410 Pulmonary fibrosis, unspecified: Secondary | ICD-10-CM

## 2016-10-06 DIAGNOSIS — I352 Nonrheumatic aortic (valve) stenosis with insufficiency: Secondary | ICD-10-CM | POA: Diagnosis not present

## 2016-10-06 NOTE — Progress Notes (Signed)
  Echocardiogram 2D Echocardiogram has been performed.  Arvil ChacoFoster, Sonya Pucci 10/06/2016, 3:17 PM

## 2016-10-07 NOTE — Addendum Note (Signed)
Addended by: Ayesha MohairWELLS, Donata Reddick E on: 10/07/2016 09:40 AM   Modules accepted: Orders

## 2016-10-12 ENCOUNTER — Other Ambulatory Visit (INDEPENDENT_AMBULATORY_CARE_PROVIDER_SITE_OTHER): Payer: Medicare Other

## 2016-10-12 ENCOUNTER — Ambulatory Visit (INDEPENDENT_AMBULATORY_CARE_PROVIDER_SITE_OTHER): Payer: Medicare Other | Admitting: Pulmonary Disease

## 2016-10-12 ENCOUNTER — Encounter: Payer: Self-pay | Admitting: Pulmonary Disease

## 2016-10-12 VITALS — BP 116/70 | HR 76 | Ht 70.0 in | Wt 216.0 lb

## 2016-10-12 DIAGNOSIS — R911 Solitary pulmonary nodule: Secondary | ICD-10-CM | POA: Insufficient documentation

## 2016-10-12 DIAGNOSIS — I25119 Atherosclerotic heart disease of native coronary artery with unspecified angina pectoris: Secondary | ICD-10-CM | POA: Diagnosis not present

## 2016-10-12 DIAGNOSIS — J8410 Pulmonary fibrosis, unspecified: Secondary | ICD-10-CM

## 2016-10-12 LAB — SEDIMENTATION RATE: SED RATE: 28 mm/h — AB (ref 0–20)

## 2016-10-12 NOTE — Assessment & Plan Note (Signed)
Likely benign, we'll obtain one year follow-up CT scan for completion in 2019

## 2016-10-12 NOTE — Assessment & Plan Note (Addendum)
On review of his prior scans, seems to date back at least to 2014 and may be even to 2006 There also seems to be some element of bronchiectasis. We'll need high-resolution CT scan of the chest to differentiate predominant bronchiectasis from traction bronchiectasis related to idiopathic pulmonary fibrosis. There is also apical emphysema related to remote smoking but I do not think that this is the main problem  Also obtain full PFTs And blood work for ANA, ESR and CCP-he does not have any stigmata of systemic disease

## 2016-10-12 NOTE — Progress Notes (Signed)
Subjective:    Patient ID: Alan Wallace, male    DOB: [birth year], 83 y.o.   MRN: [id number]  HPI   Chief Complaint  Patient presents with  . Pulm Consult    had a CT scan done, was told that he has emphysema. Denies any chest pain. Dyspnea on exertion.    83 year old remote smoker who presents for evaluation of abnormal CT scan and dyspnea. He is diabetic and hypertensive with CAD. He has been complaining of dyspnea on exertion for a few years. He underwent cardiac evaluation including as exercise stress test where his heart rate seemed to increase within a few minutes. He subsequently underwent cardiac Stent was placed to his dyspnea persisted and in fact has been worse since January 2018. His cardiologist and obtained a CT scan of his chest which showed apical emphysema and bibasilar interstitial scarring. A right lower lobe 9 mm nodule was noted and calcified granulomas were also noted. I have reviewed these films, in fact on review of his prior CT abdomen dating back 12/2012, interstitial scarring and right lower lobe nodule seemed to be present. In his scan from 2006, mild scarring is noted-I note that these are low-resolution CT scans.   He denies cough, he does report Pradaxa bronchitis in the last year. 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 He reports exposure to cotton dust and minimal exposure to his pistols. He quit smoking in 1968. He denies frequent chest colds or pneumonia.      Past Medical History:  Diagnosis Date  . Diabetes (HCC)   . Diverticulosis   . Gastritis   . Hiatal hernia   . Internal hemorrhoids   . Pancreatitis   . Renal failure    dehydration      Past Surgical History:  Procedure Laterality Date  . ELBOW SURGERY Right   . fatty tumor removal     multiple  . KNEE SURGERY Right     No Known Allergies  Social History   Social History  . Marital status: Married    Spouse name: N/A  . Number of  children: N/A  . Years of education: N/A   Occupational History  . Retired    Social History Main Topics  . Smoking status: Former Games developer  . Smokeless tobacco: Never Used     Comment: quit 50+ years ago  . Alcohol use No  . Drug use: No  . Sexual activity: Not on file   Other Topics Concern  . Not on file   Social History Narrative  . No narrative on file      Family History  Problem Relation Age of Onset  . Colon cancer Father   . Pancreatic cancer Father   . Thyroid cancer Sister   . Kidney disease Neg Hx   . Liver disease Neg Hx   . Prostate cancer Neg Hx   . Stomach cancer Neg Hx      Review of Systems  Positive for dyspnea on exertion, acid heartburn and indigestion and nasal congestion    Constitutional: negative for anorexia, fevers and sweats  Eyes: negative for irritation, redness and visual disturbance  Ears, nose, mouth, throat, and face: negative for earaches, epistaxis, nasal congestion and sore throat  Respiratory: negative for cough,  sputum and wheezing  Cardiovascular: negative for chest pain, dyspnea, lower extremity edema, orthopnea, palpitations and syncope  Gastrointestinal: negative for abdominal pain, constipation, diarrhea, melena, nausea and vomiting  Genitourinary:negative for dysuria, frequency and hematuria  Hematologic/lymphatic: negative for bleeding, easy bruising and lymphadenopathy  Musculoskeletal:negative for arthralgias, muscle weakness and stiff joints  Neurological: negative for coordination problems, gait problems, headaches and weakness  Endocrine: negative for diabetic symptoms including polydipsia, polyuria and weight loss     Objective:   Physical Exam  Gen. Pleasant, obese, in no distress, normal affect ENT - no lesions, no post nasal drip, class 2-3 airway Neck: No JVD, no thyromegaly, no carotid bruits Lungs: no use of accessory muscles, no dullness to percussion, bibasal rales without rales or rhonchi    Cardiovascular: Rhythm regular, heart sounds  normal, no murmurs or gallops, no peripheral edema Abdomen: soft and non-tender, no hepatosplenomegaly, BS normal. Musculoskeletal: No deformities, no cyanosis or clubbing Neuro:  alert, non focal, no tremors       Assessment & Plan:

## 2016-10-12 NOTE — Patient Instructions (Signed)
You may have pulmonary fibrosis or scarring at the bottom of the lungs You also have a nodule in the right lung which has been stable for a long time   Blood work today Schedule PFTs in our office Schedule high-resolution CT scan of the lungs in 6 weeks

## 2016-10-13 LAB — ANTI-NUCLEAR AB-TITER (ANA TITER): ANA Titer 1: 1:80 {titer} — ABNORMAL HIGH

## 2016-10-13 LAB — ANA: ANA: POSITIVE — AB

## 2016-10-13 LAB — CYCLIC CITRUL PEPTIDE ANTIBODY, IGG: Cyclic Citrullin Peptide Ab: <16 Units

## 2016-10-27 ENCOUNTER — Ambulatory Visit (INDEPENDENT_AMBULATORY_CARE_PROVIDER_SITE_OTHER): Payer: Medicare Other | Admitting: Cardiology

## 2016-10-27 VITALS — BP 122/76 | HR 79 | Ht 70.0 in | Wt 217.1 lb

## 2016-10-27 DIAGNOSIS — J8410 Pulmonary fibrosis, unspecified: Secondary | ICD-10-CM | POA: Diagnosis not present

## 2016-10-27 DIAGNOSIS — I25119 Atherosclerotic heart disease of native coronary artery with unspecified angina pectoris: Secondary | ICD-10-CM

## 2016-10-27 DIAGNOSIS — E785 Hyperlipidemia, unspecified: Secondary | ICD-10-CM | POA: Diagnosis not present

## 2016-10-27 DIAGNOSIS — R0602 Shortness of breath: Secondary | ICD-10-CM | POA: Diagnosis not present

## 2016-10-27 DIAGNOSIS — I10 Essential (primary) hypertension: Secondary | ICD-10-CM

## 2016-10-27 NOTE — Progress Notes (Signed)
Cardiology Office Note:    Date:  10/28/2016   ID:  Alan Wallace, DOB 1932/04/09, MRN [id number]  PCP:  Alan Bass, MD  Cardiologist:  Alan Herrlich, MD    Referring MD: Alan Bass, MD    ASSESSMENT:    1. SOB (shortness of breath)   2. Coronary artery disease involving native coronary artery of native heart with angina pectoris (HCC)   3. Essential hypertension   4. Dyslipidemia   5. Pulmonary fibrosis (HCC)    PLAN:    In order of problems listed above:  1. Unimproved appears to be predominantly due to underlying lung disease emphysema fibrosis however the patient may be correct Alan Wallace to may be part of the problem and to be discontinued and Alan Wallace is see his response and to complete his pulmonary evaluation. At this time I do not feel he requires repeat coronary arteriography 2. Stable continue current treatment ARB diuretic 3. Stable continue high intensity statin 4. Being seen by pulmonary awaiting follow-up testing   Next appointment: 3 months   Medication Adjustments/Labs and Tests Ordered: Current medicines are reviewed at length with the patient today.  Concerns regarding medicines are outlined above.  No orders of the defined types were placed in this encounter.  Meds ordered this encounter  Medications  . clopidogrel (PLAVIX) 75 MG tablet    Sig: Take 1 tablet (75 mg total) by mouth daily.    Dispense:  90 tablet    Refill:  3    Chief Complaint  Patient presents with  . Follow-up    flup after testing    History of Present Illness:    Alan Wallace is a 83 y.o. male with a hx of PCI and stent of his LAD without ACS 05/04/16.At that time he had rapid progression of exertional dyspnea that was severe and limiting abnormal stress test found to have severe obstructive CAD and our expectation was to be improved afterwards. Despite the fact that he continues to exercise he still has severe limiting exertional dyspnea somewhat variable at  times with minor activities and marked exercise intolerance. He has had no typical anginal symptoms. His chest x-ray prior to cardiac intervention showed pulmonary fibrosis last seen one month ago. Since then he has had pulmonary evaluation with PFT and high resolution CT chest pending.  He has been seen by pulmonary but continues to have exertional shortness of breath but despite that is able to exercise on a regular basis. He's had no angina palpitations syncope. The very insightful man with good healthcare literacy and asked me if his antiplatelet drug Proventil may be contributing. Alan will discontinue transition to clopidogrel and asked him to follow-up with pulmonary for the high resolution CT scan and PFTs and to let me know if he is unimproved. Compliance with diet, lifestyle and medications: Yes Past Medical History:  Diagnosis Date  . Diabetes (HCC)   . Diverticulosis   . Gastritis   . Hiatal hernia   . Internal hemorrhoids   . Pancreatitis   . Renal failure    dehydration    Past Surgical History:  Procedure Laterality Date  . ELBOW SURGERY Right   . fatty tumor removal     multiple  . KNEE SURGERY Right     Current Medications: Current Meds  Medication Sig  . aspirin 81 MG tablet Take 81 mg by mouth daily.  Marland Kitchen atorvastatin (LIPITOR) 40 MG tablet Take 20 mg by mouth daily.   Marland Kitchen  B-D UF III MINI PEN NEEDLES 31G X 5 MM MISC   . carvedilol (COREG) 12.5 MG tablet Take 12.5 mg by mouth 2 (two) times daily with a meal.  . clopidogrel (PLAVIX) 75 MG tablet Take 1 tablet (75 mg total) by mouth daily.  . clotrimazole-betamethasone (LOTRISONE) cream Apply 1 application topically 2 (two) times daily as needed.   . insulin detemir (LEVEMIR) 100 UNIT/ML injection Inject 18 Units into the skin daily.   . insulin lispro (HUMALOG KWIKPEN) 100 UNIT/ML KiwkPen INJECT 5 UNITS IN THE AM, 3 UNITS AT NOON, AND 5 UNITS AT DINNER  . latanoprost (XALATAN) 0.005 % ophthalmic solution Place 1 drop into  both eyes at bedtime.   . nitroGLYCERIN (NITROSTAT) 0.4 MG SL tablet Place 0.4 mg under the tongue every 5 (five) minutes as needed for chest pain.  Marland Kitchen omeprazole (PRILOSEC) 20 MG capsule Take 20 mg by mouth 2 (two) times daily before a meal.  . valsartan-hydrochlorothiazide (DIOVAN-HCT) 320-12.5 MG tablet TK 1 T PO QAM FOR HIGH BP     Allergies:   Patient has no known allergies.   Social History   Social History  . Marital status: Married    Spouse name: N/A  . Number of children: N/A  . Years of education: N/A   Occupational History  . Retired    Social History Main Topics  . Smoking status: Former Games developer  . Smokeless tobacco: Never Used     Comment: quit 50+ years ago  . Alcohol use No  . Drug use: No  . Sexual activity: Not Asked   Other Topics Concern  . None   Social History Narrative  . None     Family History: The patient's family history includes Colon cancer in his father; Pancreatic cancer in his father; Thyroid cancer in his sister. There is no history of Kidney disease, Liver disease, Prostate cancer, or Stomach cancer. ROS:   Please see the history of present illness.    All other systems reviewed and are negative.  EKGs/Labs/Other Studies Reviewed:    The following studies were reviewed today:  CT of chest IMPRESSION: 1. Stable emphysematous changes and basilar pulmonary scarring. 2. Stable 9.5 mm right lower lobe pulmonary nodule since prior abdominal CT scan 2017. Recommend followup noncontrast chest CT in 12 months to document 2 years of stability. 3. No acute infiltrates or pleural effusion. 4. No mediastinal or hilar mass or adenopathy. 5. Stable atherosclerotic calcifications involving the aorta and branch vessels including three-vessel coronary artery calcifications. Echo 10/06/16: - Left ventricle: The cavity size was normal. Systolic function was   normal. The estimated ejection fraction was in the range of 60%   to 65%. Wall motion was  normal; there were no regional wall   motion abnormalities. Doppler parameters are consistent with   abnormal left ventricular relaxation (grade 1 diastolic   dysfunction). - Aortic valve: There was mild stenosis. There was mild   regurgitation. Peak velocity (S): 250 cm/s. Mean gradient (S): 14   mm Hg. Valve area (VTI): 1.21 cm^2. Valve area (Vmax): 1.2 cm^2.   Valve area (Vmean): 1.14 cm^2. - Left atrium: The atrium was mildly dilated. Recent Labs: 09/21/2016: ALT 19; BNP CANCELED; BUN 19; Creatinine, Ser 0.82; Potassium 4.4; Sodium 142; TSH 1.630  Recent Lipid Panel No results found for: CHOL, TRIG, HDL, CHOLHDL, VLDL, LDLCALC, LDLDIRECT  Physical Exam:    VS:  BP 122/76 (BP Location: Right Arm, Patient Position: Sitting)   Pulse 79  Ht 5\' 10"  (1.778 m)   Wt 217 lb 1.9 oz (98.5 kg)   SpO2 93%   BMI 31.15 kg/m     Wt Readings from Last 3 Encounters:  10/27/16 217 lb 1.9 oz (98.5 kg)  10/12/16 216 lb (98 kg)  09/21/16 217 lb 1.9 oz (98.5 kg)     GEN:  Well nourished, well developed in no acute distress HEENT: Normal NECK: No JVD; No carotid bruits LYMPHATICS: No lymphadenopathy CARDIAC: RRR, no murmurs, rubs, gallops RESPIRATORY:  Clear to auscultation without rales, wheezing or rhonchi  ABDOMEN: Soft, non-tender, non-distended MUSCULOSKELETAL:  No edema; No deformity  SKIN: Warm and dry NEUROLOGIC:  Alert and oriented x 3 PSYCHIATRIC:  Normal affect    Signed, Alan HerrlichBrian Munley, MD  10/28/2016 2:31 PM    [facility]

## 2016-10-28 ENCOUNTER — Encounter: Payer: Self-pay | Admitting: Cardiology

## 2016-10-28 DIAGNOSIS — R0602 Shortness of breath: Secondary | ICD-10-CM | POA: Insufficient documentation

## 2016-10-28 MED ORDER — CLOPIDOGREL BISULFATE 75 MG PO TABS: 75.0000 mg | ORAL_TABLET | Freq: Every day | ORAL | 3 refills | Status: AC

## 2016-10-28 NOTE — Patient Instructions (Signed)
Medication Instructions:  Your physician has recommended you make the following change in your medication:  STOP Brilinta START clopidogrel (Plavix) 75 mg daily   Labwork: None  Testing/Procedures: None  Follow-Up: Your physician wants you to follow-up in: 6 months. You will receive a reminder letter in the mail two months in advance. If you don't receive a letter, please call our office to schedule the follow-up appointment.   Any Other Special Instructions Will Be Listed Below (If Applicable).     If you need a refill on your cardiac medications before your next appointment, please call your pharmacy.

## 2016-11-23 ENCOUNTER — Ambulatory Visit (INDEPENDENT_AMBULATORY_CARE_PROVIDER_SITE_OTHER)
Admission: RE | Admit: 2016-11-23 | Discharge: 2016-11-23 | Disposition: A | Discharge status: Home / Self Care | Payer: Medicare Other | Source: Ambulatory Visit | Attending: Pulmonary Disease | Admitting: Pulmonary Disease

## 2016-11-23 DIAGNOSIS — J8410 Pulmonary fibrosis, unspecified: Secondary | ICD-10-CM

## 2016-11-29 ENCOUNTER — Ambulatory Visit (INDEPENDENT_AMBULATORY_CARE_PROVIDER_SITE_OTHER): Payer: Medicare Other | Admitting: Pulmonary Disease

## 2016-11-29 DIAGNOSIS — J8410 Pulmonary fibrosis, unspecified: Secondary | ICD-10-CM | POA: Diagnosis not present

## 2016-11-29 LAB — PULMONARY FUNCTION TEST
DL/VA % pred: 53 %
DL/VA: 2.44 ml/min/mmHg/L
DLCO UNC % PRED: 40 %
DLCO UNC: 13.01 ml/min/mmHg
DLCO cor % pred: 42 %
DLCO cor: 13.77 ml/min/mmHg
FEF 25-75 Post: 1.06 L/sec
FEF 25-75 Pre: 0.85 L/sec
FEF2575-%Change-Post: 24 %
FEF2575-%PRED-POST: 56 %
FEF2575-%PRED-PRE: 45 %
FEV1-%CHANGE-POST: 9 %
FEV1-%Pred-Post: 74 %
FEV1-%Pred-Pre: 68 %
FEV1-POST: 2.07 L
FEV1-Pre: 1.89 L
FEV1FVC-%Change-Post: 7 %
FEV1FVC-%Pred-Pre: 78 %
FEV6-%CHANGE-POST: 1 %
FEV6-%Pred-Post: 92 %
FEV6-%Pred-Pre: 90 %
FEV6-POST: 3.36 L
FEV6-Pre: 3.32 L
FEV6FVC-%Change-Post: 0 %
FEV6FVC-%Pred-Post: 104 %
FEV6FVC-%Pred-Pre: 104 %
FVC-%Change-Post: 1 %
FVC-%Pred-Post: 88 %
FVC-%Pred-Pre: 86 %
FVC-Post: 3.46 L
FVC-Pre: 3.41 L
POST FEV1/FVC RATIO: 60 %
PRE FEV1/FVC RATIO: 55 %
PRE FEV6/FVC RATIO: 97 %
Post FEV6/FVC ratio: 97 %
RV % pred: 110 %
RV: 2.99 L
TLC % PRED: 89 %
TLC: 6.35 L

## 2016-11-29 NOTE — Progress Notes (Signed)
PFT done today. 

## 2016-12-06 ENCOUNTER — Ambulatory Visit (INDEPENDENT_AMBULATORY_CARE_PROVIDER_SITE_OTHER): Payer: Medicare Other | Admitting: Pulmonary Disease

## 2016-12-06 ENCOUNTER — Encounter: Payer: Self-pay | Admitting: Pulmonary Disease

## 2016-12-06 DIAGNOSIS — J432 Centrilobular emphysema: Secondary | ICD-10-CM | POA: Diagnosis not present

## 2016-12-06 DIAGNOSIS — J849 Interstitial pulmonary disease, unspecified: Secondary | ICD-10-CM | POA: Diagnosis not present

## 2016-12-06 DIAGNOSIS — I25119 Atherosclerotic heart disease of native coronary artery with unspecified angina pectoris: Secondary | ICD-10-CM

## 2016-12-06 NOTE — Assessment & Plan Note (Addendum)
Very low DLCO indicates a combination of emphysema and ILD. Favor NSIP , No evidence of collagen-vascular disease, appears nonprogressive compared to prior imaging If Anoro does not work, we may consider treatment trial of low-dose steroids, concern about his diabetes  Okay to continue exercise program He Does Not Seem to Require Oxygen at This Time

## 2016-12-06 NOTE — Progress Notes (Signed)
   Subjective:    Patient ID: Alan Wallace, male    DOB: [birth year], 83 y.o.   MRN: [id number]  HPI  83 year old remote smoker for FU of ILD, radiologically dating back to 2006 and dyspnea. 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 He reports exposure to cotton dust . He quit smoking in 1968.   His main complaint is lack of energy and excessive daytime fatigue. He works out at [employer] and was able to go 30 minutes on the bike at the start of the year but now can only do 15 minutes. He does report dyspnea on exertion, no cough or wheezing or frequent chest colds  We reviewed his imaging studies and PFTs and blood work today  Significant tests/ events reviewed CT abdomen  12/2012 >> interstitial scarring and right lower lobe nodule seemed to be present  09/2016  CT  chest which showed apical emphysema and bibasilar interstitial scarring. A right lower lobe 9 mm nodule was noted and calcified granulomas were also noted. 11/2016 HRCT > ' indeterminate ', favor NSIP  ANA 1: 80 homogenous  PFTs 11/2016 >>Ratio 55, FEV1 68%, FVC 86%, and no bronchodilator response TLC 89% and DLCO 40%  Past Medical History:  Diagnosis Date  . Diabetes (HCC)   . Diverticulosis   . Gastritis   . Hiatal hernia   . Internal hemorrhoids   . Pancreatitis   . Renal failure    dehydration      Review of Systems neg for any significant sore throat, dysphagia, itching, sneezing, nasal congestion or excess/ purulent secretions, fever, chills, sweats, unintended wt loss, pleuritic or exertional cp, hempoptysis, orthopnea pnd or change in chronic leg swelling. Also denies presyncope, palpitations, heartburn, abdominal pain, nausea, vomiting, diarrhea or change in bowel or urinary habits, dysuria,hematuria, rash, arthralgias, visual complaints, headache, numbness weakness or ataxia.     Objective:   Physical Exam  Gen. Pleasant,  well-nourished, in no distress ENT - no thrush, no post nasal drip Neck: No JVD, no thyromegaly, no carotid bruits Lungs: no use of accessory muscles, no dullness to percussion, BL scatteredt rales, no rhonchi  Cardiovascular: Rhythm regular, heart sounds  normal, no murmurs or gallops, no peripheral edema Musculoskeletal: No deformities, no cyanosis or clubbing        Assessment & Plan:

## 2016-12-06 NOTE — Patient Instructions (Addendum)
Your scan shows changes of emphysema and scar tissue/fibrosis and your lungs -this may be related to inflammation.  Trial of ANORO once daily - call back for prescription if this works.  If this does not work, we may consider treatment trial of low-dose steroids  Okay to continue exercise program

## 2016-12-06 NOTE — Assessment & Plan Note (Signed)
Trial of ANORO once daily - call back for prescription if this works.

## 2016-12-16 ENCOUNTER — Telehealth: Payer: Self-pay | Admitting: Pulmonary Disease

## 2016-12-16 MED ORDER — UMECLIDINIUM-VILANTEROL 62.5-25 MCG/INH IN AEPB: 1.0000 | INHALATION_SPRAY | Freq: Every day | RESPIRATORY_TRACT | 6 refills | Status: DC

## 2016-12-16 NOTE — Telephone Encounter (Signed)
Called and spoke with  Pt and he is aware of rx that has been sent to the pharmacy per pts request. Nothing further is needed.

## 2016-12-17 ENCOUNTER — Telehealth: Payer: Self-pay | Admitting: Pulmonary Disease

## 2016-12-17 MED ORDER — FLUTICASONE FUROATE-VILANTEROL 200-25 MCG/INH IN AEPB
1.0000 | INHALATION_SPRAY | Freq: Every day | RESPIRATORY_TRACT | 0 refills | Status: AC
Start: 2016-12-17 — End: 2016-12-18

## 2016-12-17 NOTE — Telephone Encounter (Signed)
Called and with pt's spouse, Okey Regal. Okey Regal states pt was recently started on Anoro. Okey Regal states pt was reading the book and seen where is says if you have glaucoma you should not take this medication. 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  RA please advise. Thanks.

## 2016-12-17 NOTE — Telephone Encounter (Signed)
The effect on the eye would be minimal in my opinion But respecting his concern, can change to breo 200 once daily - pl give sample & he can call back for Rx if this helps

## 2016-12-17 NOTE — Telephone Encounter (Signed)
Pt's spouse, Okey Regal is aware of RA's recommendations and voiced her understanding. Okey Regal would like for pt to try sample of Breo before sending in Rx. One sample of Breo 200 has been placed up front for pick up. Nothing further needed.

## 2016-12-30 ENCOUNTER — Telehealth: Payer: Self-pay | Admitting: Pulmonary Disease

## 2016-12-30 MED ORDER — FLUTICASONE FUROATE-VILANTEROL 200-25 MCG/INH IN AEPB: 1.0000 | INHALATION_SPRAY | Freq: Every day | RESPIRATORY_TRACT | 5 refills | Status: DC

## 2016-12-30 NOTE — Telephone Encounter (Signed)
Spoke with pt's spouse, states that Virgel Bouquet was to be sent to [facility] and not [facility] [place].  This has been resent.  Nothing further needed.

## 2016-12-30 NOTE — Telephone Encounter (Signed)
Pt requesting breo rx to be sent to pharmacy (see 9/28 phone note). This has been sent.  Nothing further needed.

## 2017-01-31 ENCOUNTER — Encounter: Payer: Self-pay | Admitting: Pulmonary Disease

## 2017-01-31 ENCOUNTER — Ambulatory Visit (INDEPENDENT_AMBULATORY_CARE_PROVIDER_SITE_OTHER): Payer: Medicare Other | Admitting: Pulmonary Disease

## 2017-01-31 VITALS — BP 114/70 | HR 76 | Ht 70.0 in | Wt 217.8 lb

## 2017-01-31 DIAGNOSIS — J439 Emphysema, unspecified: Secondary | ICD-10-CM | POA: Diagnosis not present

## 2017-01-31 DIAGNOSIS — J849 Interstitial pulmonary disease, unspecified: Secondary | ICD-10-CM | POA: Diagnosis not present

## 2017-01-31 DIAGNOSIS — I25119 Atherosclerotic heart disease of native coronary artery with unspecified angina pectoris: Secondary | ICD-10-CM

## 2017-01-31 DIAGNOSIS — J432 Centrilobular emphysema: Secondary | ICD-10-CM | POA: Diagnosis not present

## 2017-01-31 MED ORDER — UMECLIDINIUM-VILANTEROL 62.5-25 MCG/INH IN AEPB: 1.0000 | INHALATION_SPRAY | Freq: Every day | RESPIRATORY_TRACT | 4 refills | Status: AC

## 2017-01-31 NOTE — Patient Instructions (Signed)
[facility]  Rx for anoro  Repeat PFTs in march 2019

## 2017-01-31 NOTE — Assessment & Plan Note (Signed)
Rx for anoro - instead of [facility]

## 2017-01-31 NOTE — Assessment & Plan Note (Signed)
[facility]  Repeat PFTs in march 2019  If worsening, consider oral steroids trial

## 2017-01-31 NOTE — Progress Notes (Signed)
   Subjective:    Patient ID: Alan Wallace, male    DOB: 23-Dec-1932, 83 y.o.   MRN: [id number]  HPI   83 year old remote smoker for FU of ILD, radiologically dating back to 2006 and dyspnea. 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 He reports exposure to cotton dust . He quit smoking in 1968.  Trial of Breo last visit >> he is not convinced that this really helped him but his wife feels that subjectively he looks better.  He is able to exercise for 25 minutes which is longer than what he used to be able to.  He had concerns about glaucoma with anoro.  He had several questions today which we addressed  Significant tests/ events reviewed CT abdomen  12/2012 >> interstitial scarring and right lower lobe nodule seemed to be present  09/2016  CT  chest which showed apical emphysema and bibasilar interstitial scarring. A right lower lobe 9 mm nodule was noted and calcified granulomas were also noted. 11/2016 HRCT > ' indeterminate ', favor NSIP  ANA 1: 80 homogenous  PFTs 11/2016 >>Ratio 55, FEV1 68%, FVC 86%, and no bronchodilator response TLC 89% and DLCO 40%   Review of Systems neg for any significant sore throat, dysphagia, itching, sneezing, nasal congestion or excess/ purulent secretions, fever, chills, sweats, unintended wt loss, pleuritic or exertional cp, hempoptysis, orthopnea pnd or change in chronic leg swelling. Also denies presyncope, palpitations, heartburn, abdominal pain, nausea, vomiting, diarrhea or change in bowel or urinary habits, dysuria,hematuria, rash, arthralgias, visual complaints, headache, numbness weakness or ataxia.     Objective:   Physical Exam   Gen. Pleasant, elderly,well-nourished, in no distress ENT - no thrush, no post nasal drip Neck: No JVD, no thyromegaly, no carotid bruits Lungs: no use of accessory muscles, no dullness to percussion, clear without rales  or rhonchi  Cardiovascular: Rhythm regular, heart sounds  normal, no murmurs or gallops, no peripheral edema Musculoskeletal: No deformities, no cyanosis or clubbing         Assessment & Plan:

## 2017-03-10 ENCOUNTER — Telehealth: Payer: Self-pay | Admitting: Pulmonary Disease

## 2017-03-10 NOTE — Telephone Encounter (Signed)
Will await fax.

## 2017-03-10 NOTE — Telephone Encounter (Signed)
She is re-faxing letter of concern for review.

## 2017-03-11 NOTE — Telephone Encounter (Signed)
We have not received this fax. Attempted to contact [facility]. No answer, no option to leave a message. Will try back.

## 2017-03-16 NOTE — Telephone Encounter (Addendum)
[facility], left message to call back.

## 2017-03-17 NOTE — Telephone Encounter (Signed)
Left message for Alan Wallace to call back. I have checked my look-at area and both fax machines and did not see any paperwork regarding this patient.

## 2017-03-18 NOTE — Telephone Encounter (Signed)
ATC, NA at her extension

## 2017-03-21 NOTE — Telephone Encounter (Signed)
Tried to reach Alan Wallace but spoke with Alan Wallace who stated that Alan Wallace was currently in a class. Gave her the information letting her know that we have not received a fax from Alan Wallace yet.   Will await the return call from Alan Wallace. Nothing further needed at this time.

## 2017-03-24 ENCOUNTER — Telehealth: Payer: Self-pay | Admitting: Pulmonary Disease

## 2017-03-24 NOTE — Telephone Encounter (Signed)
Left message on voicemail for Sherri with [facility] at [phone] returning her call regarding patient's status with his O2 order placed.  Sherri sent letter of concern on 02/22/17 regarding patient O2 desated to 84% on 6 minute walk. Patient was placed on O2 at 3 liters for therapy sessions, and would like to follow up on order.

## 2017-03-25 NOTE — Telephone Encounter (Signed)
ATC pt, no answer. Left message for pt to call back.  

## 2017-03-25 NOTE — Telephone Encounter (Signed)
Spoke with Alan Wallace, pt joined their rehab program there and she feels he needs oxygen. The pt states he does not have oxygen at home.  He needs 6L in pulmonary rehab to keep his sats above 88%. She stated she did a 6 minute walk on the patient but we never received it. He has an appt with TP on 1/14 so can we make sure he is walked and qualified for o2? Will route to jess as [employer]

## 2017-03-25 NOTE — Telephone Encounter (Signed)
Alan Wallace returning call, CB is [phone].

## 2017-04-04 ENCOUNTER — Ambulatory Visit: Payer: Medicare Other | Admitting: Adult Health

## 2017-04-08 ENCOUNTER — Ambulatory Visit (INDEPENDENT_AMBULATORY_CARE_PROVIDER_SITE_OTHER): Payer: Medicare Other | Admitting: Adult Health

## 2017-04-08 ENCOUNTER — Encounter: Payer: Self-pay | Admitting: Adult Health

## 2017-04-08 VITALS — BP 126/76 | HR 84 | Ht 70.0 in | Wt 218.4 lb

## 2017-04-08 DIAGNOSIS — Z23 Encounter for immunization: Secondary | ICD-10-CM

## 2017-04-08 DIAGNOSIS — J432 Centrilobular emphysema: Secondary | ICD-10-CM

## 2017-04-08 DIAGNOSIS — J9611 Chronic respiratory failure with hypoxia: Secondary | ICD-10-CM | POA: Diagnosis not present

## 2017-04-08 DIAGNOSIS — J849 Interstitial pulmonary disease, unspecified: Secondary | ICD-10-CM | POA: Diagnosis not present

## 2017-04-08 NOTE — Addendum Note (Signed)
Addended by: Boone MasterJONES, JESSICA E on: 04/08/2017 12:44 PM   Modules accepted: Orders

## 2017-04-08 NOTE — Assessment & Plan Note (Signed)
ILD presumed NSIP dating back with changes noted on xray since 2006 . Suspect he has progression of disease now with supplemental oxygen needs with activity .  Check PFT on return in 6 weeks  Begin oxgyen with act  Check ONO   Plan  Patient Instructions  Continue on Anoro 1 puff daily, rinse after use. Begin oxygen 3 L with walking. Check overnight oxygen test while sleeping. Follow-up in 6-8 weeks with Dr. Vassie LollAlva with pulmonary function test Please contact office for sooner follow up if symptoms do not improve or worsen or seek emergency care

## 2017-04-08 NOTE — Patient Instructions (Addendum)
Continue on Anoro 1 puff daily, rinse after use. Begin oxygen 3 L with walking. Check overnight oxygen test while sleeping. Prevnar 13 today  Follow-up in 6-8 weeks with Dr. Vassie LollAlva with pulmonary function test Please contact office for sooner follow up if symptoms do not improve or worsen or seek emergency care

## 2017-04-08 NOTE — Assessment & Plan Note (Signed)
Begin O2 3l/m walking .  Check ONO .

## 2017-04-08 NOTE — Progress Notes (Signed)
@Patient  ID: Alan Wallace, male    DOB: 1932-05-15, 84 y.o.   MRN: [id number]  Chief Complaint  Patient presents with  . Follow-up    ILDS    Referring provider: Olive Bass, MD  HPI: 84 year old remote smokerfor FU of ILD, radiologically dating back to 2006and dyspnea. 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 He reports exposure to cotton dust.He quit smoking in 1968.  Significant tests/ events reviewed CT abdomen 12/2012 >>interstitial scarring and right lower lobe nodule seemed to be present  7/2018CT chest which showed apical emphysema and bibasilar interstitial scarring. A right lower lobe 9 mm nodule was noted and calcified granulomas were also noted. 11/2016 HRCT > ' indeterminate ', favor NSIP  ANA 1: 80 homogenous  PFTs 11/2016 >>Ratio 55, FEV1 68%, FVC 86%, and no bronchodilator response TLC 89% and DLCO 40%  04/08/2017 Follow up: ILD /COPD/emphysema Patient returns for a 75-month follow-up.  Last visit patient was changed from Brio to Anoro. Unsure if this helps or not.  Does feel it helps his breathing some .  Gets winded/sob with walking long distance and up incline. Gets more winded easily .  Has minimal dry cough . No increased cough /wheezing   He was referred to pulmonary rehab.  Notes reviewed from pulmonary rehab showed that patient needed oxygen during exercise at 3 L to keep O2 saturations greater than 90% Walk test in office with O2 sats walking on room air 86% , O2 on 3l/m >90%.  Feels the oxygen really helps him walking .   Needs prevnar 13. PVX and Flu vaccine utd.      No Known Allergies  Immunization History  Administered Date(s) Administered  . Influenza, High Dose Seasonal PF 01/20/2017  . Pneumococcal Polysaccharide-23 04/09/2011    Past Medical History:  Diagnosis Date  . Diabetes (HCC)   . Diverticulosis   . Gastritis   . Hiatal  hernia   . Internal hemorrhoids   . Pancreatitis   . Renal failure    dehydration    Tobacco History: Social History   Tobacco Use  Smoking Status Former Smoker  . Packs/day: 1.00  . Years: 14.00  . Pack years: 14.00  . Last attempt to quit: 1973  . Years since quitting: 46.0  Smokeless Tobacco Never Used  Tobacco Comment   quit 50+ years ago   Counseling given: Not Answered Comment: quit 50+ years ago   Outpatient Encounter Medications as of 04/08/2017  Medication Sig  . aspirin 81 MG tablet Take 81 mg by mouth daily.  Marland Kitchen atorvastatin (LIPITOR) 40 MG tablet Take 20 mg by mouth daily.   . B-D UF III MINI PEN NEEDLES 31G X 5 MM MISC   . carvedilol (COREG) 12.5 MG tablet Take 12.5 mg by mouth 2 (two) times daily with a meal.  . clopidogrel (PLAVIX) 75 MG tablet Take 1 tablet (75 mg total) by mouth daily.  . clotrimazole-betamethasone (LOTRISONE) cream Apply 1 application topically 2 (two) times daily as needed.   . insulin detemir (LEVEMIR) 100 UNIT/ML injection Inject 18 Units into the skin daily.   . insulin lispro (HUMALOG KWIKPEN) 100 UNIT/ML KiwkPen INJECT 5 UNITS IN THE AM, 3 UNITS AT NOON, AND 5 UNITS AT DINNER  . latanoprost (XALATAN) 0.005 % ophthalmic solution Place 1 drop into both eyes at bedtime.   . nitroGLYCERIN (NITROSTAT) 0.4 MG SL tablet  Place 0.4 mg under the tongue every 5 (five) minutes as needed for chest pain.  Marland Kitchen. omeprazole (PRILOSEC) 20 MG capsule Take 20 mg by mouth 2 (two) times daily before a meal.  . umeclidinium-vilanterol (ANORO ELLIPTA) 62.5-25 MCG/INH AEPB Inhale 1 puff daily into the lungs.  . valsartan-hydrochlorothiazide (DIOVAN-HCT) 320-12.5 MG tablet TK 1 T PO QAM FOR HIGH BP  . [DISCONTINUED] fluticasone furoate-vilanterol (BREO ELLIPTA) 200-25 MCG/INH AEPB Inhale 1 puff into the lungs daily. (Patient not taking: Reported on 04/08/2017)   No facility-administered encounter medications on file as of 04/08/2017.      Review of  Systems  Constitutional:   No  weight loss, night sweats,  Fevers, chills,  +fatigue, or  lassitude.  HEENT:   No headaches,  Difficulty swallowing,  Tooth/dental problems, or  Sore throat,                No sneezing, itching, ear ache, nasal congestion, post nasal drip,   CV:  No chest pain,  Orthopnea, PND, swelling in lower extremities, anasarca, dizziness, palpitations, syncope.   GI  No heartburn, indigestion, abdominal pain, nausea, vomiting, diarrhea, change in bowel habits, loss of appetite, bloody stools.   Resp:    No chest wall deformity  Skin: no rash or lesions.  GU: no dysuria, change in color of urine, no urgency or frequency.  No flank pain, no hematuria   MS:  No joint deformity    Physical Exam  BP 126/76 (BP Location: Left Arm, Cuff Size: Normal)   Pulse 84   Ht 5\' 10"  (1.778 m)   Wt 218 lb 6.4 oz (99.1 kg)   SpO2 93%   BMI 31.34 kg/m   GEN: A/Ox3; pleasant , NAD, elderly     HEENT:  [place]/AT,  EACs-clear, TMs-wnl, NOSE-clear, THROAT-clear, no lesions, no postnasal drip or exudate noted.   NECK:  Supple w/ fair ROM; no JVD; normal carotid impulses w/o bruits; no thyromegaly or nodules palpated; no lymphadenopathy.    RESP  BB crackles ,  no accessory muscle use, no dullness to percussion  CARD:  RRR, no m/r/g, tr  peripheral edema, pulses intact, no cyanosis or clubbing.  GI:   Soft & nt; nml bowel sounds; no organomegaly or masses detected.   Musco: Warm bil, no deformities or joint swelling noted.   Neuro: alert, no focal deficits noted.    Skin: Warm, no lesions or rashes    Lab Results:  CBC   ProBNP No results found for: PROBNP  Imaging: No results found.   Assessment & Plan:   Centrilobular emphysema (HCC) Stable without flare  Cont on ANORO .   ILD (interstitial lung disease) (HCC) ILD presumed NSIP dating back with changes noted on xray since 2006 . Suspect he has progression of disease now with supplemental oxygen needs  with activity .  Check PFT on return in 6 weeks  Begin oxgyen with act  Check ONO   Plan  Patient Instructions  Continue on Anoro 1 puff daily, rinse after use. Begin oxygen 3 L with walking. Check overnight oxygen test while sleeping. Follow-up in 6-8 weeks with Dr. Vassie LollAlva with pulmonary function test Please contact office for sooner follow up if symptoms do not improve or worsen or seek emergency care       Chronic respiratory failure with hypoxia (HCC) Begin O2 3l/m walking .  Check ONO .       Rubye Oaksammy Parrett, NP 04/08/2017

## 2017-04-08 NOTE — Assessment & Plan Note (Signed)
Stable without flare  Cont on ANORO .

## 2017-04-08 NOTE — Addendum Note (Signed)
Addended by: Boone MasterJONES, Marin Milley E on: 04/08/2017 11:51 AM   Modules accepted: Orders

## 2017-04-12 ENCOUNTER — Telehealth: Payer: Self-pay | Admitting: Adult Health

## 2017-04-12 NOTE — Telephone Encounter (Signed)
PCC's please advise. Thanks 

## 2017-04-12 NOTE — Telephone Encounter (Signed)
I will check the CM[place] folders [place] in [place]

## 2017-04-13 NOTE — Telephone Encounter (Signed)
This form is in Tammy Parrett's CMN Folder

## 2017-04-18 NOTE — Telephone Encounter (Signed)
TP is not in clinic today. Will forward to Jess to have TP sign when she is back in clinic.

## 2017-04-20 NOTE — Telephone Encounter (Signed)
Form signed by TP before she left for the day and given to Synetta Failnita for [employer] Called spoke with Tresa EndoKelly, made her aware  Nothing further needed; will sign off

## 2017-04-20 NOTE — Telephone Encounter (Signed)
Jess, please advise if TP has signed this form yet.  Thanks!

## 2017-04-24 ENCOUNTER — Encounter: Payer: Self-pay | Admitting: Adult Health

## 2017-05-03 ENCOUNTER — Telehealth: Payer: Self-pay | Admitting: Adult Health

## 2017-05-03 DIAGNOSIS — J849 Interstitial pulmonary disease, unspecified: Secondary | ICD-10-CM

## 2017-05-03 NOTE — Telephone Encounter (Signed)
Patient seen 1.18.19 by TP and O2 ordered with walking at 3lpm ONO on room air ordered at office visit  Received 2.3.19 ONO on room air from [facility] Reviewed by TP: mild desats, begin O2 2lpm at bedtime  LMOM TCB x1 to discuss results

## 2017-05-03 NOTE — Telephone Encounter (Signed)
Patient's spouse returned call Discussed ONO results/recs with spouse who verbalized her understanding Order placed for 2lpm at bedtime  ONO sent for scan Nothing further needed; will sign off

## 2017-05-20 ENCOUNTER — Ambulatory Visit (INDEPENDENT_AMBULATORY_CARE_PROVIDER_SITE_OTHER): Payer: Medicare Other | Admitting: Pulmonary Disease

## 2017-05-20 ENCOUNTER — Encounter: Payer: Self-pay | Admitting: Pulmonary Disease

## 2017-05-20 DIAGNOSIS — J432 Centrilobular emphysema: Secondary | ICD-10-CM | POA: Diagnosis not present

## 2017-05-20 DIAGNOSIS — J9611 Chronic respiratory failure with hypoxia: Secondary | ICD-10-CM | POA: Diagnosis not present

## 2017-05-20 DIAGNOSIS — J849 Interstitial pulmonary disease, unspecified: Secondary | ICD-10-CM

## 2017-05-20 LAB — PULMONARY FUNCTION TEST
DL/VA % PRED: 41 %
DL/VA: 1.92 ml/min/mmHg/L
DLCO UNC % PRED: 33 %
DLCO UNC: 10.7 ml/min/mmHg
FEF 25-75 POST: 1.16 L/s
FEF 25-75 PRE: 1.04 L/s
FEF2575-%Change-Post: 10 %
FEF2575-%PRED-PRE: 56 %
FEF2575-%Pred-Post: 63 %
FEV1-%Change-Post: 5 %
FEV1-%PRED-PRE: 74 %
FEV1-%Pred-Post: 78 %
FEV1-Post: 2.17 L
FEV1-Pre: 2.05 L
FEV1FVC-%Change-Post: 2 %
FEV1FVC-%PRED-PRE: 83 %
FEV6-%Change-Post: 3 %
FEV6-%PRED-POST: 95 %
FEV6-%Pred-Pre: 92 %
FEV6-POST: 3.47 L
FEV6-Pre: 3.35 L
FEV6FVC-%CHANGE-POST: -1 %
FEV6FVC-%PRED-POST: 103 %
FEV6FVC-%Pred-Pre: 104 %
FVC-%Change-Post: 3 %
FVC-%Pred-Post: 91 %
FVC-%Pred-Pre: 88 %
FVC-Post: 3.59 L
FVC-Pre: 3.46 L
POST FEV1/FVC RATIO: 60 %
PRE FEV1/FVC RATIO: 59 %
Post FEV6/FVC ratio: 97 %
Pre FEV6/FVC Ratio: 98 %
RV % pred: 105 %
RV: 2.86 L
TLC % PRED: 88 %
TLC: 6.26 L

## 2017-05-20 NOTE — Assessment & Plan Note (Signed)
Continue 2 L during sleep and 3 L on exertion See meds will be filled out and we will try to get him a portable concentrator

## 2017-05-20 NOTE — Progress Notes (Signed)
   Subjective:    Patient ID: Alan ChuDaniel R Wallace, male    DOB: [birth year], 84 y.o.   MRN: [id number]  HPI  84 year old remote smokerfor FU of ILD, radiologically dating back to 2006and dyspnea. 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 He reports exposure to cotton dust.He quit smoking in 1968.  Trial of ANORO > he is not able to tell a huge difference He required oxygen during pulmonary rehab , he was found to desaturate to 84% on 6-minute walk test -needed up to 3 L on exertion.  On his last visit he was also asked to start 2 L during sleep.  He has a cemented to be followed today by [facility].  Overall his breathing has been stable, he has been bothered by painful left hip, was given a steroid shot and a steroid taper and this worsened his sugars, he could not tell if his breathing got any better while on steroids  PFTs were reviewed today-shows stable FVC at 88%, ratio 59 with FEV1 of 74%, no bronchodilator response, TLC is preserved and DLCO was decreased at 33% but corrects to 41% for alveolar volume     Significant tests/ events reviewed CT abdomen 12/2012 >>interstitial scarring and right lower lobe nodule seemed to be present  7/2018CT chest which showed apical emphysema and bibasilar interstitial scarring. A right lower lobe 9 mm nodule was noted and calcified granulomas were also noted. 11/2016 HRCT > ' indeterminate ', favor NSIP  ANA 1: 80 homogenous  PFTs 11/2016 >>Ratio 55, FEV1 68%, FVC 86%, and no bronchodilator response TLC 89% and DLCO 40%   Review of Systems neg for any significant sore throat, dysphagia, itching, sneezing, nasal congestion or excess/ purulent secretions, fever, chills, sweats, unintended wt loss, pleuritic or exertional cp, hempoptysis, orthopnea pnd or change in chronic leg swelling. Also denies presyncope, palpitations, heartburn, abdominal  pain, nausea, vomiting, diarrhea or change in bowel or urinary habits, dysuria,hematuria, rash, arthralgias, visual complaints, headache, numbness weakness or ataxia.     Objective:   Physical Exam   Gen. Pleasant, well-nourished, in no distress ENT - no thrush, no post nasal drip Neck: No JVD, no thyromegaly, no carotid bruits Lungs: no use of accessory muscles, no dullness to percussion, left basal rales no rhonchi  Cardiovascular: Rhythm regular, heart sounds  normal, no murmurs or gallops, no peripheral edema Musculoskeletal: No deformities, no cyanosis or clubbing         Assessment & Plan:

## 2017-05-20 NOTE — Addendum Note (Signed)
Addended by: Boone MasterJONES, Jobeth Pangilinan E on: 05/20/2017 12:51 PM   Modules accepted: Orders

## 2017-05-20 NOTE — Assessment & Plan Note (Signed)
His lung function appears stable compared to 11/2016, diffusion may have dropped slightly but corrects for alveolar volume. He is symptomatically stable. He did not feel significantly improved by trial of steroids that he received for his hip. We will repeat CT chest high resolution in 3 months and if any clinical worsening will consider longer duration of low-dose steroids

## 2017-05-20 NOTE — Addendum Note (Signed)
Addended by: Jacquiline DoeROGDON, Jaleiah Asay M on: 05/20/2017 12:49 PM   Modules accepted: Orders

## 2017-05-20 NOTE — Addendum Note (Signed)
Addended by: Jacquiline DoeROGDON, Jyquan Kenley M on: 05/20/2017 12:42 PM   Modules accepted: Orders

## 2017-05-20 NOTE — Patient Instructions (Signed)
High resolution CT chest without contrast in 3 months [facility] Use oxygen during sleep & while walking for a distance

## 2017-05-20 NOTE — Progress Notes (Signed)
PFT done today. 

## 2017-05-20 NOTE — Assessment & Plan Note (Signed)
Complete pulmonary rehab program or his exercise regimen on his own. Continue ANORO

## 2017-05-24 NOTE — Progress Notes (Signed)
Cardiology Office Note:    Date:  05/25/2017   ID:  Alan Wallace, DOB 1932/10/12, MRN [id number]  PCP:  Olive Bassough, Robert L, MD  Cardiologist:  Norman HerrlichBrian Munley, MD    Referring MD: Olive Bassough, Robert L, MD    ASSESSMENT:    1. Coronary artery disease involving native coronary artery of native heart with angina pectoris (HCC)   2. Essential hypertension   3. Other hyperlipidemia   4. Chronic respiratory failure with hypoxia (HCC)   5. Centrilobular emphysema (HCC)   6. ILD (interstitial lung disease) (HCC)   7. Nonrheumatic aortic valve stenosis   8. Nonrheumatic aortic valve insufficiency    PLAN:    In order of problems listed above:  1. Stable continue medical treatment drop aspirin as he is 1 year from PCI reviewed the option of ischemia evaluation is having no angina and will continue medical therapy at this time 2. Stable blood pressure target continue current treatment 3. Stable lipids are ideal with CAD continue statin 4. Improved continue current pulmonary care and oxygen 5. Improved 6. Stable   Next appointment: 6 months   Medication Adjustments/Labs and Tests Ordered: Current medicines are reviewed at length with the patient today.  Concerns regarding medicines are outlined above.  Orders Placed This Encounter  Procedures  . Rhythm ECG, report   No orders of the defined types were placed in this encounter.   Chief Complaint  Patient presents with  . Follow-up    6 month flup appt     History of Present Illness:    Alan Wallace is a 84 y.o. male with a hx of CAD, PCI and stent of LAD wo ACS 05/04/16 and persistent severe limiting SOB with pulmonary fibrosis and hypoxemia last seen 6 months ago.He required oxygen during pulmonary rehab , he was found to desaturate to 84% on 6-minute walk test -needed up to 3 L on exertion.  On his last visit he was also asked to start 2 L during sleep.PFTs were reviewed and showed stable FVC at 88%, ratio 59 with FEV1 of  74%, no bronchodilator response, TLC is preserved and DLCO was decreased at 33% but corrects to 41% for alveolar volume Compliance with diet, lifestyle and medications: Yes  He is improved with his pulmonary care and oxygen still fatigues easily no chest pain edema orthopnea palpitation or syncope.  He is 1 year from PCI and will drop aspirin to continue clopidogrel.  Recent labs reviewed lipids are at target Past Medical History:  Diagnosis Date  . Diabetes (HCC)   . Diverticulosis   . Gastritis   . Hiatal hernia   . Internal hemorrhoids   . Pancreatitis   . Renal failure    dehydration    Past Surgical History:  Procedure Laterality Date  . ELBOW SURGERY Right   . fatty tumor removal     multiple  . KNEE SURGERY Right     Current Medications: Current Meds  Medication Sig  . atorvastatin (LIPITOR) 40 MG tablet Take 20 mg by mouth daily.   . B-D UF III MINI PEN NEEDLES 31G X 5 MM MISC   . carvedilol (COREG) 12.5 MG tablet Take 12.5 mg by mouth 2 (two) times daily with a meal.  . clopidogrel (PLAVIX) 75 MG tablet Take 1 tablet (75 mg total) by mouth daily.  . clotrimazole-betamethasone (LOTRISONE) cream Apply 1 application topically 2 (two) times daily as needed.   . insulin detemir (LEVEMIR) 100 UNIT/ML injection Inject  16 Units into the skin daily.   . insulin lispro (HUMALOG KWIKPEN) 100 UNIT/ML KiwkPen INJECT 7 UNITS IN THE AM, 3 UNITS AT NOON, AND 7 UNITS AT DINNER  . latanoprost (XALATAN) 0.005 % ophthalmic solution Place 1 drop into both eyes at bedtime.   . nitroGLYCERIN (NITROSTAT) 0.4 MG SL tablet Place 0.4 mg under the tongue every 5 (five) minutes as needed for chest pain.  Marland Kitchen omeprazole (PRILOSEC) 20 MG capsule Take 20 mg by mouth 2 (two) times daily before a meal.  . umeclidinium-vilanterol (ANORO ELLIPTA) 62.5-25 MCG/INH AEPB Inhale 1 puff daily into the lungs.  . valsartan-hydrochlorothiazide (DIOVAN-HCT) 320-12.5 MG tablet TK 1 T PO QAM FOR HIGH BP  .  [DISCONTINUED] aspirin 81 MG tablet Take 81 mg by mouth daily.     Allergies:   Patient has no known allergies.   Social History   Socioeconomic History  . Marital status: Married    Spouse name: None  . Number of children: None  . Years of education: None  . Highest education level: None  Social Needs  . Financial resource strain: None  . Food insecurity - worry: None  . Food insecurity - inability: None  . Transportation needs - medical: None  . Transportation needs - non-medical: None  Occupational History  . Occupation: Retired  Tobacco Use  . Smoking status: Former Smoker    Packs/day: 1.00    Years: 14.00    Pack years: 14.00    Last attempt to quit: 1973    Years since quitting: 46.2  . Smokeless tobacco: Never Used  . Tobacco comment: quit 50+ years ago  Substance and Sexual Activity  . Alcohol use: No    Alcohol/week: 0.0 oz  . Drug use: No  . Sexual activity: None  Other Topics Concern  . None  Social History Narrative  . None     Family History: The patient's family history includes Colon cancer in his father; Pancreatic cancer in his father; Thyroid cancer in his sister. There is no history of Kidney disease, Liver disease, Prostate cancer, or Stomach cancer. ROS:   Please see the history of present illness.    All other systems reviewed and are negative.  EKGs/Labs/Other Studies Reviewed:    The following studies were reviewed today:  EKG:  EKG ordered today.  The ekg ordered today demonstrates sinus rhythm to APCs otherwise normal  Recent Labs:CMP normal 04/27/17 09/21/2016: ALT 19; BNP CANCELED; BUN 19; Creatinine, Ser 0.82; Potassium 4.4; Sodium 142; TSH 1.630  Recent Lipid Panel Chol 140 LDL 62 HDL 58 No results found for: CHOL, TRIG, HDL, CHOLHDL, VLDL, LDLCALC, LDLDIRECT  Physical Exam:    VS:  BP 136/68 (BP Location: Right Arm, Patient Position: Sitting, Cuff Size: Large)   Pulse 72   Ht 5\' 10"  (1.778 m)   Wt 218 lb 12.8 oz (99.2 kg)    SpO2 93%   BMI 31.39 kg/m     Wt Readings from Last 3 Encounters:  05/25/17 218 lb 12.8 oz (99.2 kg)  05/20/17 217 lb (98.4 kg)  04/08/17 218 lb 6.4 oz (99.1 kg)     GEN:  Well nourished, well developed in no acute distress HEENT: Normal NECK: No JVD; No carotid bruits LYMPHATICS: No lymphadenopathy CARDIAC: RRR, no murmurs, rubs, gallops RESPIRATORY:  Clear to auscultation without rales, wheezing or rhonchi  ABDOMEN: Soft, non-tender, non-distended MUSCULOSKELETAL:  Trace edema; No deformity  SKIN: Warm and dry NEUROLOGIC:  Alert and oriented x 3  PSYCHIATRIC:  Normal affect    Signed, Norman Herrlich, MD  05/25/2017 1:03 PM    [facility]

## 2017-05-25 ENCOUNTER — Ambulatory Visit (INDEPENDENT_AMBULATORY_CARE_PROVIDER_SITE_OTHER): Payer: Medicare Other | Admitting: Cardiology

## 2017-05-25 ENCOUNTER — Encounter: Payer: Self-pay | Admitting: Cardiology

## 2017-05-25 VITALS — BP 136/68 | HR 72 | Ht 70.0 in | Wt 218.8 lb

## 2017-05-25 DIAGNOSIS — I351 Nonrheumatic aortic (valve) insufficiency: Secondary | ICD-10-CM | POA: Diagnosis not present

## 2017-05-25 DIAGNOSIS — J849 Interstitial pulmonary disease, unspecified: Secondary | ICD-10-CM

## 2017-05-25 DIAGNOSIS — I10 Essential (primary) hypertension: Secondary | ICD-10-CM | POA: Diagnosis not present

## 2017-05-25 DIAGNOSIS — J432 Centrilobular emphysema: Secondary | ICD-10-CM | POA: Diagnosis not present

## 2017-05-25 DIAGNOSIS — E7849 Other hyperlipidemia: Secondary | ICD-10-CM

## 2017-05-25 DIAGNOSIS — I25119 Atherosclerotic heart disease of native coronary artery with unspecified angina pectoris: Secondary | ICD-10-CM

## 2017-05-25 DIAGNOSIS — I350 Nonrheumatic aortic (valve) stenosis: Secondary | ICD-10-CM

## 2017-05-25 DIAGNOSIS — J9611 Chronic respiratory failure with hypoxia: Secondary | ICD-10-CM | POA: Diagnosis not present

## 2017-05-25 NOTE — Addendum Note (Signed)
Addended by: Ayesha MohairWELLS, MICHAELA E on: 05/25/2017 03:27 PM   Modules accepted: Orders

## 2017-05-25 NOTE — Patient Instructions (Signed)
Medication Instructions:  Your physician has recommended you make the following change in your medication:  STOP aspirin   Labwork: None  Testing/Procedures: You had an EKG today.  Follow-Up: Your physician wants you to follow-up in: 1 year. You will receive a reminder letter in the mail two months in advance. If you don't receive a letter, please call our office to schedule the follow-up appointment.   Any Other Special Instructions Will Be Listed Below (If Applicable).     If you need a refill on your cardiac medications before your next appointment, please call your pharmacy.   

## 2017-05-31 ENCOUNTER — Other Ambulatory Visit: Payer: Medicare Other

## 2017-06-21 NOTE — Progress Notes (Signed)
Reviewed & agree with plan  

## 2017-07-07 ENCOUNTER — Telehealth: Payer: Self-pay | Admitting: Pulmonary Disease

## 2017-07-07 DIAGNOSIS — R911 Solitary pulmonary nodule: Secondary | ICD-10-CM

## 2017-07-07 NOTE — Telephone Encounter (Signed)
Left message for patient's wife to call back.  

## 2017-07-07 NOTE — Telephone Encounter (Signed)
I have reviewed his CT scan from 4/17, Right lower lobe nodule increased from 6 mm to 8 mm and new right hilar lymph node is noted. Please schedule PET scan and can keep follow-up appointment

## 2017-07-07 NOTE — Telephone Encounter (Signed)
Called and spoke to pt's spouse, Eber JonesCarolyn (DPR). Eber JonesCarolyn states pt was seen at ED last night for possible CHF. Eber JonesCarolyn states CT was performed and nodules were noted that could indicate fast spreading cancer. Eber JonesCarolyn is requesting apt with RA within one week.  First available is 07/27/17.  Eber JonesCarolyn declined apt with NP.  RA please advise. Thannks

## 2017-07-11 NOTE — Telephone Encounter (Signed)
Attempted to contact pt. I did not receive an answer. There was no option for me to leave a message. Will try back.  

## 2017-07-11 NOTE — Telephone Encounter (Signed)
Pt wife is returning call. Cb is [phone].

## 2017-07-11 NOTE — Telephone Encounter (Signed)
Spoke with Eber Jones, she is aware of RA's recommendations. Per Eber Jones, patient would feel more comfortable having the PET scan done at [place] instead of [place]. Also advised her that RA stated it would be ok for him to keep his F/U appt in May.   Will go ahead and place order for the PET scan.   Nothing else needed at time of call.

## 2017-07-15 ENCOUNTER — Ambulatory Visit (HOSPITAL_COMMUNITY)
Admission: RE | Admit: 2017-07-15 | Discharge: 2017-07-15 | Disposition: A | Discharge status: Home / Self Care | Payer: Medicare Other | Source: Ambulatory Visit | Attending: Pulmonary Disease | Admitting: Pulmonary Disease

## 2017-07-15 DIAGNOSIS — E119 Type 2 diabetes mellitus without complications: Secondary | ICD-10-CM | POA: Diagnosis not present

## 2017-07-15 DIAGNOSIS — R911 Solitary pulmonary nodule: Secondary | ICD-10-CM | POA: Diagnosis present

## 2017-07-15 DIAGNOSIS — S2231XD Fracture of one rib, right side, subsequent encounter for fracture with routine healing: Secondary | ICD-10-CM | POA: Diagnosis not present

## 2017-07-15 DIAGNOSIS — R918 Other nonspecific abnormal finding of lung field: Secondary | ICD-10-CM | POA: Diagnosis not present

## 2017-07-15 DIAGNOSIS — X58XXXD Exposure to other specified factors, subsequent encounter: Secondary | ICD-10-CM | POA: Diagnosis not present

## 2017-07-15 LAB — GLUCOSE, CAPILLARY: GLUCOSE-CAPILLARY: 152 mg/dL — AB (ref 65–99)

## 2017-07-15 MED ORDER — FLUDEOXYGLUCOSE F - 18 (FDG) INJECTION
10.9300 | Freq: Once | INTRAVENOUS | Status: AC | PRN
  Administered 2017-07-15: 10.93 via INTRAVENOUS

## 2017-07-28 ENCOUNTER — Encounter: Payer: Self-pay | Admitting: Pulmonary Disease

## 2017-07-28 ENCOUNTER — Ambulatory Visit (INDEPENDENT_AMBULATORY_CARE_PROVIDER_SITE_OTHER): Payer: Medicare Other | Admitting: Pulmonary Disease

## 2017-07-28 VITALS — BP 140/72 | HR 61 | Ht 68.5 in | Wt 213.6 lb

## 2017-07-28 DIAGNOSIS — J849 Interstitial pulmonary disease, unspecified: Secondary | ICD-10-CM | POA: Diagnosis not present

## 2017-07-28 DIAGNOSIS — I25119 Atherosclerotic heart disease of native coronary artery with unspecified angina pectoris: Secondary | ICD-10-CM | POA: Diagnosis not present

## 2017-07-28 DIAGNOSIS — R911 Solitary pulmonary nodule: Secondary | ICD-10-CM

## 2017-07-28 DIAGNOSIS — J9611 Chronic respiratory failure with hypoxia: Secondary | ICD-10-CM

## 2017-07-28 DIAGNOSIS — J432 Centrilobular emphysema: Secondary | ICD-10-CM | POA: Diagnosis not present

## 2017-07-28 NOTE — Assessment & Plan Note (Signed)
06/2017 PET scan reviewed with patient and spouse Previous CTs reviewed with spouse and patient High-res CT in June 2019 Follow-up with Dr. Britt Bottom in 2 months

## 2017-07-28 NOTE — Assessment & Plan Note (Signed)
Continue current regimen High-res CT in June 2019 Patient follow-up with Dr. Vassie Loll in 2 months

## 2017-07-28 NOTE — Assessment & Plan Note (Signed)
Patient currently on 2 L of oxygen at night Patient also currently on 2 L of oxygen with exertion  Patient has option of going to pulmonary rehab order is still active and patient is able to coordinate and agrees to call if needed.  Patient can also get SPO2 monitor from pharmacy.  Patient knows to titrate based off of oxygenation levels of 90 to 96% with exertion.  Patient agrees to call office with any questions or concerns or if there is a problem getting back into pulmonary rehab.  Patient to follow-up with Dr. Vassie Loll in 2 months Patient to have high-res CT in June 2019

## 2017-07-28 NOTE — Patient Instructions (Addendum)
Patient has option of purchasing personal SPO2 monitor and monitoring oxygenation at the gym for an oxygenation level of 90 to 96% with exertion  Patient can also contact [place] pulmonary rehab and finish their appointments.  Patient acknowledges understanding of both of these options  Patient will follow-up in 2 months with Dr. Vassie Loll   Patient will have high-res CT done in June 2019  Please contact the office if your symptoms worsen or you have concerns that you are not improving.    Thank you for choosing [facility] for your healthcare, and for allowing [place] to partner with you on your healthcare journey. I am thankful to be able to provide care to you today.   Elisha Headland FNP-C

## 2017-07-28 NOTE — Progress Notes (Signed)
  ID: Alan Wallace, male    DOB: 1932-05-08, 84 y.o.   MRN: [id number]  Chief Complaint  Patient presents with  . Follow-up    ILD, PET scan    Referring provider: Olive Bass, MD  HPI:   84 year old remote smoker for follow-up of ILD, radiology dating back to 2006 with dyspnea. 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  Reports exposure to cotton dust.  Quit smoking in 1968.  Test CT abdomen 12/2012-interstitial scarring and right lower lobe nodule seem to be present 09/2016 CT chest which showed Pickel emphysema and bibasilar steroid interstitial scarring.  A right lower lobe 9 mm nodule was noted and calcified granulomas were also noted 11/2016 HRC T-intermediate favor NSIP  04/24/17 - ONO - 2L at bedtime ordered  07/15/2017- PET scan >>Small pulmonary nodules are definitely not small pulmonary nodules are not definitely hypermetabolic but are too small to accurately characterize on a PET recommend continued CT surveillance >>No hypermetabolic mediastinal or hilar nodes >>No significant findings in the abdomen pelvis >>Healing right 10th rib fracture  ANA 1-80 homogeneous  PFTs September 2018-ratio 55, FEV1 68%, FVC 86%, and no bronchodilator response, TLC 89% and DLCO 40%  05/20/2017 -follow-up appointment with Dr. Vassie Loll 07/07/2017 [place] reviewed CT scan from 417, right lower lobe nodule increased from 6 mm to 8 mm and new hilar lymph node is noted please schedule PET scan and keep follow-up appointment  07/28/2017 Follow-up from PET scan.  Will discuss with patient today.  Patient and spouse have questions regarding PET scan and next plan.  Patient also wanting follow-up in discussion from last CT scan.  Patient's largest concern is oxygen levels when exercising at the [facility].  Patient had been going to pulmonary rehab and [place] but  stopped going due to left hip pain.  Primary care thinks that walking on the treadmill at pulmonary rehab aggravated left hip.  Patient wants to ensure his proper oxygen levels when exercising and does not want to stop exercising.   No Known Allergies  Immunization History  Administered Date(s) Administered  . Influenza, High Dose Seasonal PF 01/20/2017  . Pneumococcal Conjugate-13 04/08/2017  . Pneumococcal Polysaccharide-23 04/09/2011    Past Medical History:  Diagnosis Date  . Diabetes (HCC)   . Diverticulosis   . Gastritis   . Hiatal hernia   . Internal hemorrhoids   . Pancreatitis   . Renal failure    dehydration    Tobacco History: Social History   Tobacco Use  Smoking Status Former Smoker  . Packs/day: 1.00  . Years: 14.00  . Pack years: 14.00  . Last attempt to quit: 1973  . Years since quitting: 46.3  Smokeless Tobacco Never Used  Tobacco Comment   quit 50+ years ago   Counseling given: Not Answered Comment: quit 50+ years ago   Outpatient Encounter Medications as of 07/28/2017  Medication Sig  . atorvastatin (LIPITOR) 40 MG tablet Take 20 mg by mouth daily.   . B-D UF III MINI PEN NEEDLES 31G X 5 MM MISC   . carvedilol (COREG) 12.5 MG tablet Take 12.5 mg by mouth 2 (two) times daily with a meal.  . clopidogrel (PLAVIX) 75 MG tablet Take 1 tablet (75 mg total) by mouth daily.  . clotrimazole-betamethasone (LOTRISONE) cream Apply 1 application topically 2 (two) times daily as needed.   Marland Kitchen  gabapentin (NEURONTIN) 100 MG capsule Take 100 mg by mouth 3 (three) times daily.  . insulin detemir (LEVEMIR) 100 UNIT/ML injection Inject 16 Units into the skin daily.   . insulin lispro (HUMALOG KWIKPEN) 100 UNIT/ML KiwkPen INJECT 7 UNITS IN THE AM, 3 UNITS AT NOON, AND 7 UNITS AT DINNER  . latanoprost (XALATAN) 0.005 % ophthalmic solution Place 1 drop into both eyes at bedtime.   . nitroGLYCERIN (NITROSTAT) 0.4 MG SL tablet Place 0.4 mg under the tongue every 5 (five)  minutes as needed for chest pain.  Marland Kitchen omeprazole (PRILOSEC) 20 MG capsule Take 20 mg by mouth 2 (two) times daily before a meal.  . umeclidinium-vilanterol (ANORO ELLIPTA) 62.5-25 MCG/INH AEPB Inhale 1 puff daily into the lungs.  . valsartan-hydrochlorothiazide (DIOVAN-HCT) 320-12.5 MG tablet TK 1 T PO QAM FOR HIGH BP   No facility-administered encounter medications on file as of 07/28/2017.      Review of Systems  Constitutional:   No  weight loss, night sweats,  Fevers, chills, fatigue, or  lassitude.  HEENT:   No headaches,  Difficulty swallowing,  Tooth/dental problems, or  Sore throat, No sneezing, itching, ear ache, nasal congestion, post nasal drip,   CV:  No chest pain,  Orthopnea, PND, swelling in lower extremities, anasarca, dizziness, palpitations, syncope.   GI  No heartburn, indigestion, abdominal pain, nausea, vomiting, diarrhea, change in bowel habits, loss of appetite, bloody stools.   Resp: +shortness of breath with exertion occasional sob at rest.  No excess mucus, no productive cough,  No non-productive cough,  No coughing up of blood.  No change in color of mucus.  No wheezing.  No chest wall deformity  Skin: no rash or lesions.  GU: no dysuria, change in color of urine, no urgency or frequency.  No flank pain, no hematuria   MS:  +Left hip pain improving  No joint pain or swelling.  No decreased range of motion.  No back pain.    Physical Exam  BP 140/72 (BP Location: Left Arm, Cuff Size: Normal)   Pulse 61   Ht 5' 8.5" (1.74 m)   Wt 213 lb 9.6 oz (96.9 kg)   SpO2 91%   BMI 32.01 kg/m   GEN: A/Ox3; pleasant , NAD, well nourished    HEENT:  [place]/AT,  EACs-clear, moderate cerumen, TMs-wnl, NOSE-clear, THROAT-clear, no lesions, no postnasal drip or exudate noted.   NECK:  Supple w/ fair ROM; no JVD; normal carotid impulses w/o bruits; no thyromegaly or nodules palpated; no lymphadenopathy.    RESP  Clear but diminished in bases P & A; w/o, wheezes/ rales/ or  rhonchi. no accessory muscle use, no dullness to percussion  CARD:  RRR, no m/r/g, no peripheral edema, pulses intact, no cyanosis or clubbing.  GI:   Soft & nt; nml bowel sounds; no organomegaly or masses detected.   Musco: Warm bil, no deformities or joint swelling noted.   Neuro: alert, no focal deficits noted.    Skin: Warm, no lesions or rashes    Lab Results:  CBC    Component Value Date/Time   WBC 7.0 05/10/2013 1142   RBC 4.09 (L) 05/10/2013 1142   HGB 13.0 05/10/2013 1142   HCT 39.3 05/10/2013 1142   PLT 153.0 05/10/2013 1142   MCV 96.0 05/10/2013 1142   MCHC 33.1 05/10/2013 1142   RDW 14.0 05/10/2013 1142   LYMPHSABS 1.6 05/10/2013 1142   MONOABS 0.7 05/10/2013 1142   EOSABS 0.1 05/10/2013 1142  BASOSABS 0.0 05/10/2013 1142    BMET    Component Value Date/Time   NA 142 09/21/2016 1536   K 4.4 09/21/2016 1536   CL 100 09/21/2016 1536   CO2 26 09/21/2016 1536   GLUCOSE 126 (H) 09/21/2016 1536   GLUCOSE 125 (H) 05/10/2013 1142   BUN 19 09/21/2016 1536   CREATININE 0.82 09/21/2016 1536   CALCIUM 8.7 09/21/2016 1536   GFRNONAA 82 09/21/2016 1536   GFRAA 95 09/21/2016 1536    BNP    Component Value Date/Time   BNP CANCELED 09/21/2016 1536    ProBNP No results found for: PROBNP  Imaging: Nm Pet Image Initial (pi) Skull Base To Thigh  Result Date: 07/15/2017 CLINICAL DATA:  Initial treatment strategy for pulmonary nodules. EXAM: NUCLEAR MEDICINE PET SKULL BASE TO THIGH TECHNIQUE: 10.93 mCi F-18 FDG was injected intravenously. Full-ring PET imaging was performed from the skull base to thigh after the radiotracer. CT data was obtained and used for attenuation correction and anatomic localization. Fasting blood glucose: 152 mg/dl COMPARISON:  Chest CT 16/12/9602 FINDINGS: Mediastinal blood pool activity: SUV max 2.82 NECK: No hypermetabolic lymph nodes in the neck. Incidental CT findings: none CHEST: The pulmonary nodules are not hypermetabolic but too  small to accurately characterize with PET. No hypermetabolic mediastinal or hilar lymph nodes. 12 mm right paratracheal node is stable and not hypermetabolic. 1.7 cm right infrahilar lymph node seen on the prior PET-CT appears improved but is difficult to measure exactly without contrast. No hypermetabolism is demonstrated. This was likely inflammatory. Incidental CT findings: none ABDOMEN/PELVIS: No abnormal hypermetabolic activity within the liver, pancreas, adrenal glands, or spleen. No hypermetabolic lymph nodes in the abdomen or pelvis. Incidental CT findings: Left renal calculus and left renal cyst. Sigmoid diverticulosis without diverticulitis. Enlarged prostate gland with mass effect on the base the bladder. SKELETON: Hypermetabolic focus in the right tenth rib laterally is due to a healing fracture. No worrisome bone lesions or other areas of hypermetabolism. Incidental CT findings: none IMPRESSION: 1. Small pulmonary nodules are not definitely hypermetabolic but are too small to accurately characterize on PET. Recommend continued CT surveillance. 2. No hypermetabolic mediastinal or hilar lymph nodes. 3. No significant findings in the abdomen/pelvis. 4. Healing right tenth rib fracture. Electronically Signed   By: Rudie Meyer M.D.   On: 07/15/2017 17:14     Assessment & Plan:   Chronic respiratory failure with hypoxia [facility]) Patient currently on 2 L of oxygen at night Patient also currently on 2 L of oxygen with exertion  Patient has option of going to pulmonary rehab order is still active and patient is able to coordinate and agrees to call if needed.  Patient can also get SPO2 monitor from pharmacy.  Patient knows to titrate based off of oxygenation levels of 90 to 96% with exertion.  Patient agrees to call office with any questions or concerns or if there is a problem getting back into pulmonary rehab.  Patient to follow-up with Dr. Vassie Loll in 2 months Patient to have high-res CT in June  2019   ILD (interstitial lung disease) [facility]) Continue current regimen High-res CT in June 2019 Patient follow-up with Dr. Vassie Loll in 2 months   Centrilobular emphysema [facility]) Patient to continue on anoro Ellipta Patient to evaluate oxygen levels during exit exercise by either going to pulmonary rehab or by an SPO2 monitor and checking at the [facility] as he exercises for an oxygen level of 90 to 96%  Patient to follow-up in  2 months   Pulmonary nodule 06/2017 PET scan reviewed with patient and spouse Previous CTs reviewed with spouse and patient High-res CT in June 2019 Follow-up with Dr. Britt Bottom in 2 months    Appointment time with patient was 45 minutes.  Reviewed PET scan, previous CTs, Ono results, and previous oxygenation test with spouse and patient in great detail.  Discussed patient's case with Dr. Maple Hudson.  Over 50% of this time was spent in direct patient care and patient education and review of imaging.  Coral Ceo, NP 07/28/2017

## 2017-07-28 NOTE — Assessment & Plan Note (Addendum)
Patient to continue on anoro Ellipta Patient to evaluate oxygen levels during exit exercise by either going to pulmonary rehab or by an SPO2 monitor and checking at the [facility] as he exercises for an oxygen level of 90 to 96%  Patient to follow-up in 2 months

## 2017-08-20 DEATH — deceased

## 2017-08-22 ENCOUNTER — Other Ambulatory Visit: Payer: Medicare Other

## 2017-08-26 ENCOUNTER — Ambulatory Visit: Payer: Medicare Other | Admitting: Pulmonary Disease

## 2017-08-30 ENCOUNTER — Ambulatory Visit: Payer: Medicare Other | Admitting: Pulmonary Disease

## 2017-08-30 ENCOUNTER — Other Ambulatory Visit: Payer: Medicare Other

## 2019-04-17 IMAGING — PT NM PET TUM IMG INITIAL (PI) SKULL BASE T - THIGH
8 series · 25 of 25 positions shown · non-contrast
Comparison: Chest CT 07/06/2017

CLINICAL DATA: Initial treatment strategy for pulmonary nodules.

EXAM:
NUCLEAR MEDICINE PET SKULL BASE TO THIGH
TECHNIQUE: 10.93 mCi F-18 FDG was injected intravenously. Full-ring PET imaging
was performed from the skull base to thigh after the radiotracer. CT
data was obtained and used for attenuation correction and anatomic
localization.
Fasting blood glucose: 152 mg/dl

[Series 3: pet sk_thigh ac · axial · 5.0mm · 4.07mm/px · z∈[-978,-66]mm · 5 of 229 slices shown]
[im 1/229]
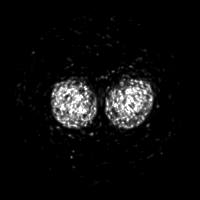
[im 58/229]
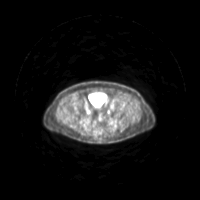
[im 115/229]
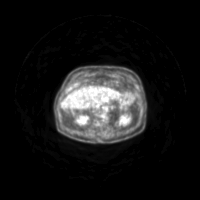
[im 172/229]
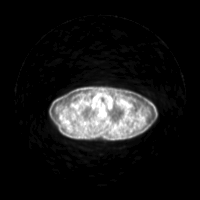
[im 229/229]
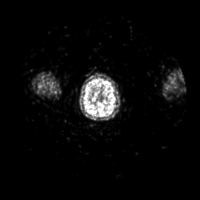

[Series 4: ct sk_thigh 5.0 b31f · axial · 5.0mm · 0.98mm/px · z∈[-978,-66]mm · 5 of 229 slices shown]
[im 1/229]
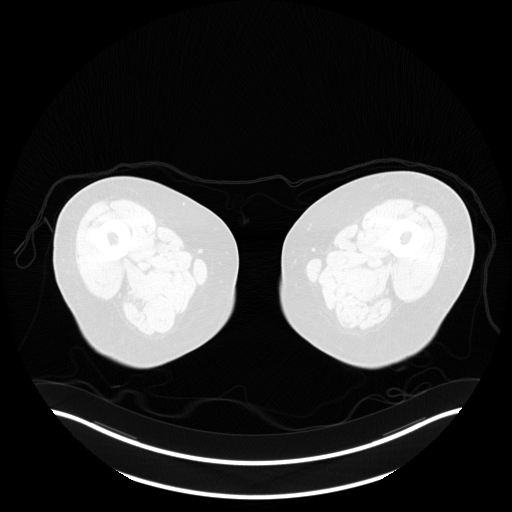
[im 58/229]
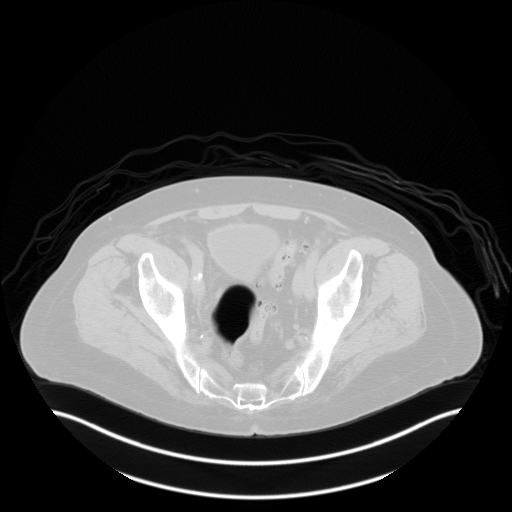
[im 115/229]
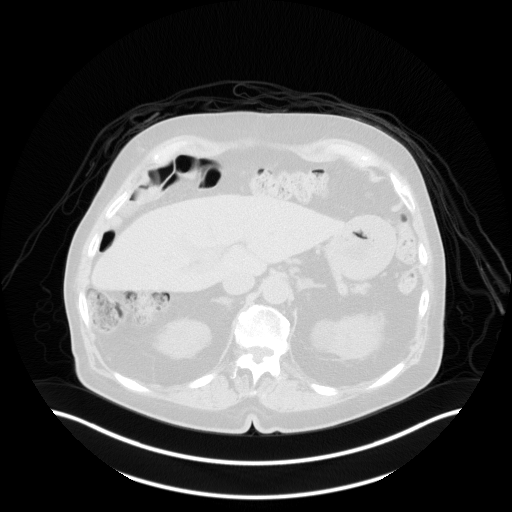
[im 172/229]
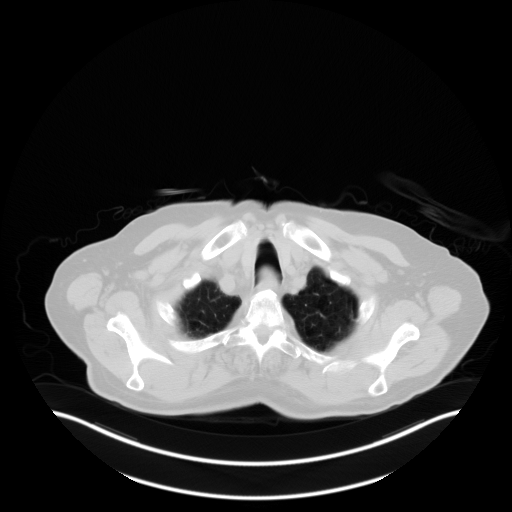
[im 229/229  brain]
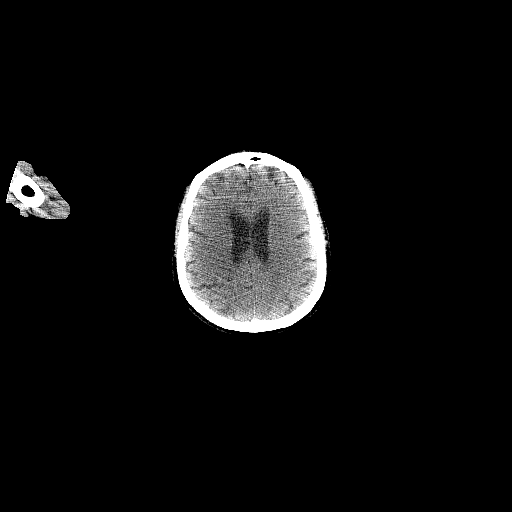

[Series 5: pet sk_thigh nac · axial · 5.0mm · 4.07mm/px · z∈[-978,-66]mm · 5 of 229 slices shown]
[im 1/229]
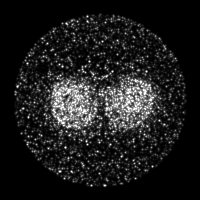
[im 58/229]
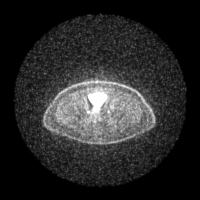
[im 115/229]
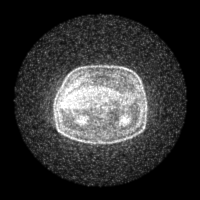
[im 172/229]
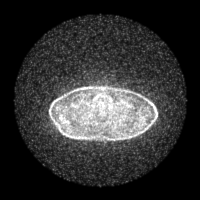
[im 229/229]
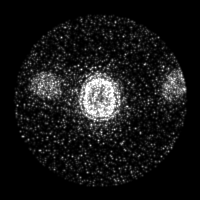

[Series 8: ct sk_thigh 5.0 b70f (id)_bone · axial · 5.0mm · 0.70mm/px · 1 of 62 slices shown]
[im 1/62  bone]
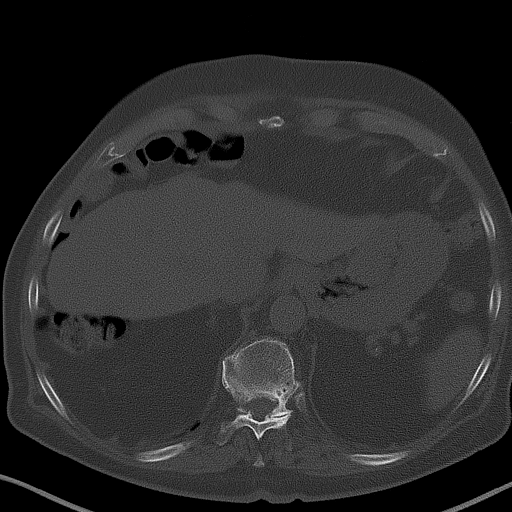

[Series 603: range-ct sk_thigh 5.0 (id)<alpha range> · 2 of 75 slices shown (1 of 2)]
[im 1/75]
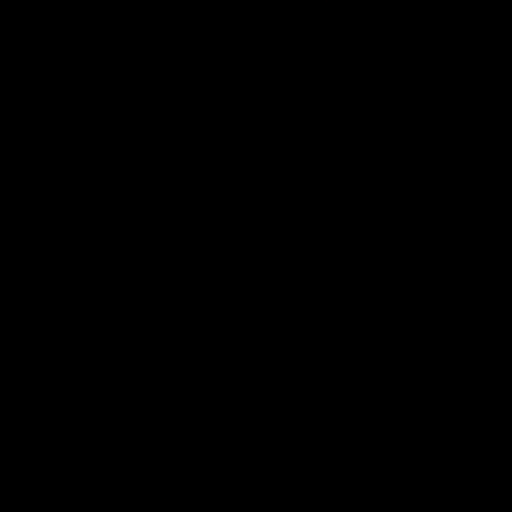
[im 75/75]
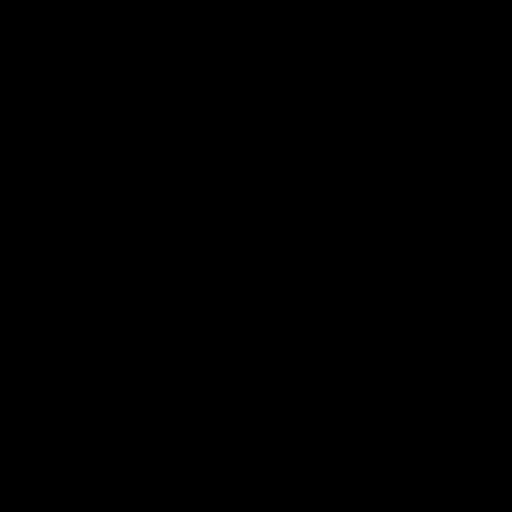

[Series 604: mip range 2 · coronal · 1.89mm/px · 1 of 32 slices shown]
[im 1/32]
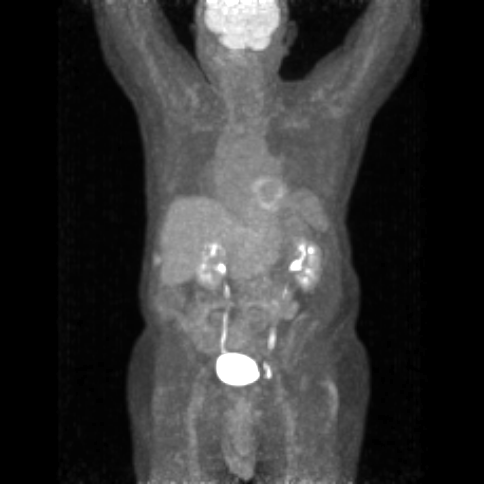

[Series 605: range-ct sk_thigh 5.0 (id)<alpha range> · 5 of 215 slices shown (2 of 2)]
[im 1/215]
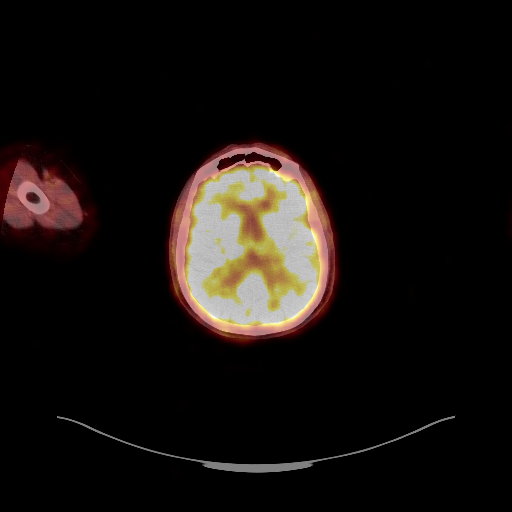
[im 54/215]
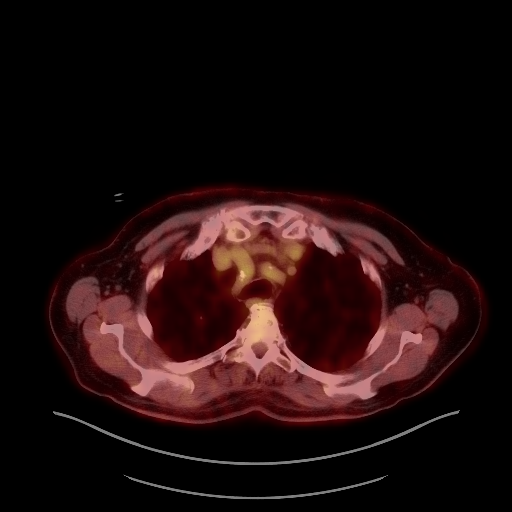
[im 108/215]
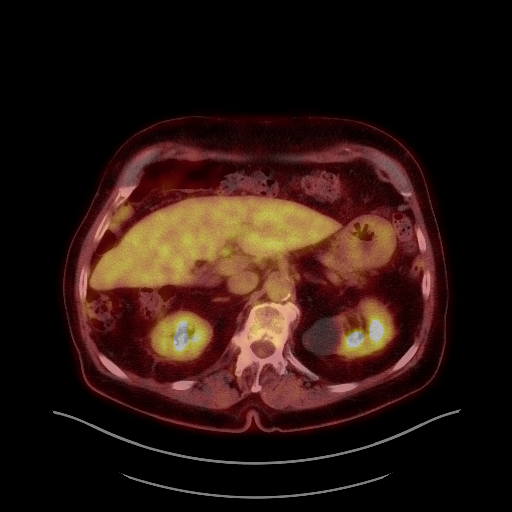
[im 161/215]
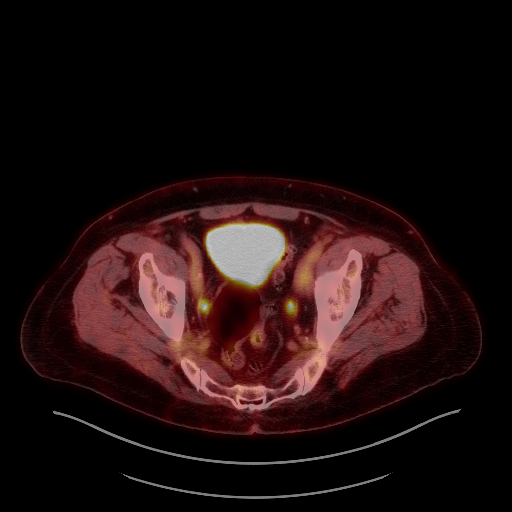
[im 215/215]
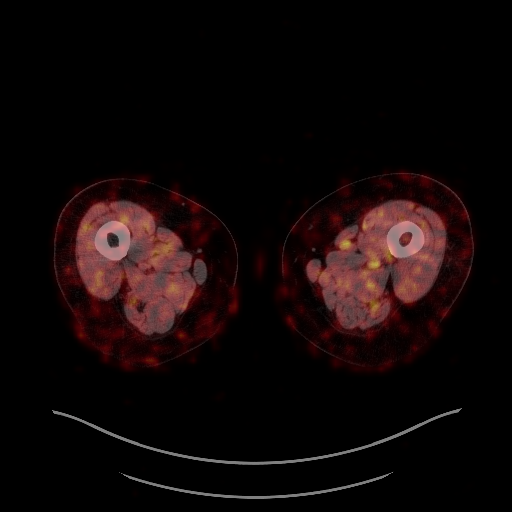

[Series 1063: results mm oncology reading · 1.0mm · 1.02mm/px · 1 of 2 slices shown]
[im 1/2]
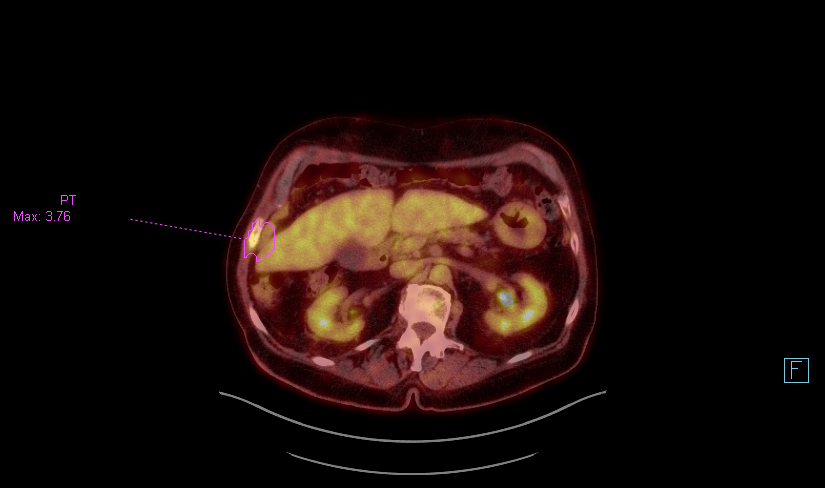

[25 of 25 positions shown; findings below may reference images not displayed]

FINDINGS: Mediastinal blood pool activity: SUV max

NECK: No hypermetabolic lymph nodes in the neck.

Incidental CT findings: none

CHEST: The pulmonary nodules are not hypermetabolic but too small to
accurately characterize with PET. No hypermetabolic mediastinal or
hilar lymph nodes. 12 mm right paratracheal node is stable and not
hypermetabolic.

1.7 cm right infrahilar lymph node seen on the prior PET-CT appears
improved but is difficult to measure exactly without contrast. No
hypermetabolism is demonstrated. This was likely inflammatory.

Incidental CT findings: none

ABDOMEN/PELVIS: No abnormal hypermetabolic activity within the
liver, pancreas, adrenal glands, or spleen. No hypermetabolic lymph
nodes in the abdomen or pelvis.

Incidental CT findings: Left renal calculus and left renal cyst.
Sigmoid diverticulosis without diverticulitis. Enlarged prostate
gland with mass effect on the base the bladder.

SKELETON: Hypermetabolic focus in the right tenth rib laterally is
due to a healing fracture. No worrisome bone lesions or other areas
of hypermetabolism.

Incidental CT findings: none
IMPRESSION: 1. Small pulmonary nodules are not definitely hypermetabolic but are
too small to accurately characterize on PET. Recommend continued CT
surveillance.
2. No hypermetabolic mediastinal or hilar lymph nodes.
3. No significant findings in the abdomen/pelvis.
4. Healing right tenth rib fracture.
